# Patient Record
Sex: Male | Born: 1940 | Race: Black or African American | Hispanic: No | Marital: Married | Smoking: Never smoker
Health system: Southern US, Community
[De-identification: ages and names within clinical notes are randomized; demographics above are authoritative.]

## PROBLEM LIST (undated history)

## (undated) DIAGNOSIS — R413 Other amnesia: Secondary | ICD-10-CM

## (undated) DIAGNOSIS — D649 Anemia, unspecified: Secondary | ICD-10-CM

## (undated) DIAGNOSIS — Z8371 Family history of colonic polyps: Secondary | ICD-10-CM

## (undated) DIAGNOSIS — Z83719 Family history of colon polyps, unspecified: Secondary | ICD-10-CM

## (undated) DIAGNOSIS — R972 Elevated prostate specific antigen [PSA]: Secondary | ICD-10-CM

## (undated) DIAGNOSIS — E785 Hyperlipidemia, unspecified: Secondary | ICD-10-CM

## (undated) DIAGNOSIS — Z789 Other specified health status: Secondary | ICD-10-CM

## (undated) HISTORY — DX: Other amnesia: R41.3

## (undated) HISTORY — PX: CATARACT EXTRACTION: SUR2

## (undated) HISTORY — DX: Family history of colon polyps, unspecified: Z83.719

## (undated) HISTORY — PX: HEMORRHOID SURGERY: SHX153

## (undated) HISTORY — PX: OTHER SURGICAL HISTORY: SHX169

## (undated) HISTORY — DX: Other specified health status: Z78.9

## (undated) HISTORY — DX: Elevated prostate specific antigen (PSA): R97.20

## (undated) HISTORY — DX: Anemia, unspecified: D64.9

## (undated) HISTORY — PX: COLONOSCOPY W/ POLYPECTOMY: SHX1380

## (undated) HISTORY — DX: Hyperlipidemia, unspecified: E78.5

## (undated) HISTORY — DX: Family history of colonic polyps: Z83.71

---

## 1999-03-05 ENCOUNTER — Encounter: Admission: RE | Admit: 1999-03-05 | Discharge: 1999-06-03 | Payer: Self-pay | Admitting: Internal Medicine

## 1999-07-02 ENCOUNTER — Encounter: Admission: RE | Admit: 1999-07-02 | Discharge: 1999-09-30 | Payer: Self-pay | Admitting: Internal Medicine

## 2004-09-05 ENCOUNTER — Ambulatory Visit: Payer: Self-pay | Admitting: Internal Medicine

## 2004-09-06 ENCOUNTER — Ambulatory Visit: Payer: Self-pay | Admitting: Internal Medicine

## 2004-10-17 ENCOUNTER — Ambulatory Visit: Payer: Self-pay | Admitting: Internal Medicine

## 2004-12-11 ENCOUNTER — Ambulatory Visit: Payer: Self-pay | Admitting: Internal Medicine

## 2004-12-17 ENCOUNTER — Encounter: Admission: RE | Admit: 2004-12-17 | Discharge: 2004-12-17 | Payer: Self-pay | Admitting: Internal Medicine

## 2005-02-12 ENCOUNTER — Ambulatory Visit: Payer: Self-pay | Admitting: Internal Medicine

## 2005-07-01 ENCOUNTER — Ambulatory Visit (HOSPITAL_COMMUNITY): Admission: RE | Admit: 2005-07-01 | Discharge: 2005-07-01 | Payer: Self-pay | Admitting: Internal Medicine

## 2005-07-01 ENCOUNTER — Ambulatory Visit: Payer: Self-pay | Admitting: Internal Medicine

## 2005-09-08 ENCOUNTER — Ambulatory Visit: Payer: Self-pay | Admitting: Internal Medicine

## 2006-07-10 DIAGNOSIS — E119 Type 2 diabetes mellitus without complications: Secondary | ICD-10-CM

## 2006-07-15 ENCOUNTER — Ambulatory Visit: Payer: Self-pay | Admitting: Internal Medicine

## 2006-07-24 ENCOUNTER — Ambulatory Visit: Payer: Self-pay | Admitting: Internal Medicine

## 2007-11-17 ENCOUNTER — Ambulatory Visit: Payer: Self-pay | Admitting: Internal Medicine

## 2007-11-17 DIAGNOSIS — R972 Elevated prostate specific antigen [PSA]: Secondary | ICD-10-CM

## 2007-11-23 ENCOUNTER — Encounter (INDEPENDENT_AMBULATORY_CARE_PROVIDER_SITE_OTHER): Payer: Self-pay | Admitting: *Deleted

## 2007-11-23 ENCOUNTER — Telehealth (INDEPENDENT_AMBULATORY_CARE_PROVIDER_SITE_OTHER): Payer: Self-pay | Admitting: *Deleted

## 2007-11-25 ENCOUNTER — Encounter (INDEPENDENT_AMBULATORY_CARE_PROVIDER_SITE_OTHER): Payer: Self-pay | Admitting: *Deleted

## 2007-11-25 ENCOUNTER — Ambulatory Visit: Payer: Self-pay | Admitting: Internal Medicine

## 2007-11-25 LAB — CONVERTED CEMR LAB
OCCULT 1: NEGATIVE
OCCULT 2: NEGATIVE
OCCULT 3: NEGATIVE

## 2007-12-20 ENCOUNTER — Encounter: Payer: Self-pay | Admitting: Internal Medicine

## 2008-05-11 ENCOUNTER — Ambulatory Visit: Payer: Self-pay | Admitting: Internal Medicine

## 2008-05-14 LAB — CONVERTED CEMR LAB: Hgb A1c MFr Bld: 6.8 % — ABNORMAL HIGH (ref 4.6–6.5)

## 2008-05-16 ENCOUNTER — Encounter (INDEPENDENT_AMBULATORY_CARE_PROVIDER_SITE_OTHER): Payer: Self-pay | Admitting: *Deleted

## 2008-05-16 ENCOUNTER — Telehealth (INDEPENDENT_AMBULATORY_CARE_PROVIDER_SITE_OTHER): Payer: Self-pay | Admitting: *Deleted

## 2008-05-29 ENCOUNTER — Ambulatory Visit: Payer: Self-pay | Admitting: Internal Medicine

## 2008-05-29 DIAGNOSIS — Z860100 Personal history of colon polyps, unspecified: Secondary | ICD-10-CM | POA: Insufficient documentation

## 2008-06-01 ENCOUNTER — Encounter: Payer: Self-pay | Admitting: Internal Medicine

## 2008-06-05 ENCOUNTER — Encounter (INDEPENDENT_AMBULATORY_CARE_PROVIDER_SITE_OTHER): Payer: Self-pay | Admitting: *Deleted

## 2008-06-05 LAB — CONVERTED CEMR LAB
BUN: 12 mg/dL (ref 6–23)
Basophils Absolute: 0 10*3/uL (ref 0.0–0.1)
Basophils Relative: 0.5 % (ref 0.0–3.0)
Creatinine, Ser: 0.7 mg/dL (ref 0.4–1.5)
Eosinophils Absolute: 0.6 10*3/uL (ref 0.0–0.7)
Eosinophils Relative: 18 % — ABNORMAL HIGH (ref 0.0–5.0)
Folate: 7.1 ng/mL
HCT: 37.8 % — ABNORMAL LOW (ref 39.0–52.0)
Hemoglobin: 12.8 g/dL — ABNORMAL LOW (ref 13.0–17.0)
Iron: 71 ug/dL (ref 42–165)
Lymphocytes Relative: 42.8 % (ref 12.0–46.0)
Lymphs Abs: 1.3 10*3/uL (ref 0.7–4.0)
MCHC: 33.8 g/dL (ref 30.0–36.0)
MCV: 91.7 fL (ref 78.0–100.0)
Monocytes Absolute: 0.3 10*3/uL (ref 0.1–1.0)
Monocytes Relative: 10.7 % (ref 3.0–12.0)
Neutro Abs: 0.9 10*3/uL — ABNORMAL LOW (ref 1.4–7.7)
Neutrophils Relative %: 28 % — ABNORMAL LOW (ref 43.0–77.0)
Platelets: 187 10*3/uL (ref 150.0–400.0)
Potassium: 4.2 meq/L (ref 3.5–5.1)
RBC: 4.11 M/uL — ABNORMAL LOW (ref 4.22–5.81)
RDW: 12.8 % (ref 11.5–14.6)
Saturation Ratios: 17 % — ABNORMAL LOW (ref 20.0–50.0)
Transferrin: 298.5 mg/dL (ref 212.0–360.0)
Uric Acid, Serum: 5.6 mg/dL (ref 4.0–7.8)
Vitamin B-12: 398 pg/mL (ref 211–911)
WBC: 3.1 10*3/uL — ABNORMAL LOW (ref 4.5–10.5)

## 2008-08-29 ENCOUNTER — Ambulatory Visit: Payer: Self-pay | Admitting: Internal Medicine

## 2008-09-03 LAB — CONVERTED CEMR LAB
BUN: 13 mg/dL (ref 6–23)
Creatinine, Ser: 0.7 mg/dL (ref 0.4–1.5)
Hgb A1c MFr Bld: 6.3 % (ref 4.6–6.5)

## 2008-09-04 ENCOUNTER — Encounter (INDEPENDENT_AMBULATORY_CARE_PROVIDER_SITE_OTHER): Payer: Self-pay | Admitting: *Deleted

## 2008-12-25 ENCOUNTER — Encounter: Payer: Self-pay | Admitting: Internal Medicine

## 2009-04-13 ENCOUNTER — Telehealth (INDEPENDENT_AMBULATORY_CARE_PROVIDER_SITE_OTHER): Payer: Self-pay | Admitting: *Deleted

## 2009-04-26 ENCOUNTER — Ambulatory Visit: Payer: Self-pay | Admitting: Internal Medicine

## 2009-04-26 DIAGNOSIS — E785 Hyperlipidemia, unspecified: Secondary | ICD-10-CM

## 2009-04-26 DIAGNOSIS — D649 Anemia, unspecified: Secondary | ICD-10-CM

## 2009-04-30 LAB — CONVERTED CEMR LAB
ALT: 25 units/L (ref 0–53)
AST: 25 units/L (ref 0–37)
Albumin: 3.9 g/dL (ref 3.5–5.2)
Alkaline Phosphatase: 52 units/L (ref 39–117)
BUN: 13 mg/dL (ref 6–23)
Basophils Absolute: 0 10*3/uL (ref 0.0–0.1)
Basophils Relative: 0.5 % (ref 0.0–3.0)
Bilirubin, Direct: 0.1 mg/dL (ref 0.0–0.3)
CO2: 30 meq/L (ref 19–32)
Calcium: 9.7 mg/dL (ref 8.4–10.5)
Chloride: 108 meq/L (ref 96–112)
Cholesterol: 183 mg/dL (ref 0–200)
Creatinine, Ser: 0.7 mg/dL (ref 0.4–1.5)
Creatinine,U: 164.2 mg/dL
Eosinophils Absolute: 0.1 10*3/uL (ref 0.0–0.7)
Eosinophils Relative: 2.8 % (ref 0.0–5.0)
Folate: 8.7 ng/mL
GFR calc non Af Amer: 145.01 mL/min (ref >60)
Glucose, Bld: 98 mg/dL (ref 70–99)
HCT: 38.5 % — ABNORMAL LOW (ref 39.0–52.0)
HDL: 72.3 mg/dL (ref >39.00)
Hemoglobin: 12.6 g/dL — ABNORMAL LOW (ref 13.0–17.0)
Hgb A1c MFr Bld: 6.5 % (ref 4.6–6.5)
Iron: 75 ug/dL (ref 42–165)
LDL Cholesterol: 99 mg/dL (ref 0–99)
Lymphocytes Relative: 30.9 % (ref 12.0–46.0)
Lymphs Abs: 1 10*3/uL (ref 0.7–4.0)
MCHC: 32.8 g/dL (ref 30.0–36.0)
MCV: 95.6 fL (ref 78.0–100.0)
Microalb Creat Ratio: 12.8 mg/g (ref 0.0–30.0)
Microalb, Ur: 2.1 mg/dL — ABNORMAL HIGH (ref 0.0–1.9)
Monocytes Absolute: 0.3 10*3/uL (ref 0.1–1.0)
Monocytes Relative: 10.6 % (ref 3.0–12.0)
Neutro Abs: 1.9 10*3/uL (ref 1.4–7.7)
Neutrophils Relative %: 55.2 % (ref 43.0–77.0)
Platelets: 173 10*3/uL (ref 150.0–400.0)
Potassium: 4.3 meq/L (ref 3.5–5.1)
RBC: 4.02 M/uL — ABNORMAL LOW (ref 4.22–5.81)
RDW: 12.3 % (ref 11.5–14.6)
Saturation Ratios: 16.5 % — ABNORMAL LOW (ref 20.0–50.0)
Sodium: 142 meq/L (ref 135–145)
TSH: 2.27 microintl units/mL (ref 0.35–5.50)
Total Bilirubin: 0.6 mg/dL (ref 0.3–1.2)
Total CHOL/HDL Ratio: 3
Total Protein: 7.2 g/dL (ref 6.0–8.3)
Transferrin: 323.8 mg/dL (ref 212.0–360.0)
Triglycerides: 59 mg/dL (ref 0.0–149.0)
VLDL: 11.8 mg/dL (ref 0.0–40.0)
Vitamin B-12: 277 pg/mL (ref 211–911)
WBC: 3.3 10*3/uL — ABNORMAL LOW (ref 4.5–10.5)

## 2009-05-04 ENCOUNTER — Telehealth (INDEPENDENT_AMBULATORY_CARE_PROVIDER_SITE_OTHER): Payer: Self-pay | Admitting: *Deleted

## 2009-12-28 ENCOUNTER — Encounter: Payer: Self-pay | Admitting: Internal Medicine

## 2010-01-14 ENCOUNTER — Telehealth (INDEPENDENT_AMBULATORY_CARE_PROVIDER_SITE_OTHER): Payer: Self-pay | Admitting: *Deleted

## 2010-01-18 ENCOUNTER — Ambulatory Visit: Payer: Self-pay | Admitting: Internal Medicine

## 2010-01-21 LAB — CONVERTED CEMR LAB
Hgb A1c MFr Bld: 6.4 % (ref 4.6–6.5)
Vitamin B-12: 269 pg/mL (ref 211–911)

## 2010-03-10 LAB — CONVERTED CEMR LAB
ALT: 20 units/L (ref 0–40)
ALT: 20 units/L (ref 0–53)
AST: 19 units/L (ref 0–37)
AST: 21 units/L (ref 0–37)
Albumin: 3.8 g/dL (ref 3.5–5.2)
Alkaline Phosphatase: 53 units/L (ref 39–117)
BUN: 11 mg/dL (ref 6–23)
BUN: 9 mg/dL (ref 6–23)
Basophils Absolute: 0 10*3/uL (ref 0.0–0.1)
Basophils Absolute: 0 10*3/uL (ref 0.0–0.1)
Basophils Relative: 0.3 % (ref 0.0–1.0)
Basophils Relative: 0.7 % (ref 0.0–3.0)
Bilirubin, Direct: 0.1 mg/dL (ref 0.0–0.3)
CO2: 31 meq/L (ref 19–32)
Calcium: 9.4 mg/dL (ref 8.4–10.5)
Chloride: 107 meq/L (ref 96–112)
Cholesterol: 154 mg/dL (ref 0–200)
Cholesterol: 181 mg/dL (ref 0–200)
Creatinine, Ser: 0.7 mg/dL (ref 0.4–1.5)
Creatinine, Ser: 0.8 mg/dL (ref 0.4–1.5)
Creatinine,U: 110.9 mg/dL
Creatinine,U: 181.3 mg/dL
Eosinophils Absolute: 0.2 10*3/uL (ref 0.0–0.6)
Eosinophils Absolute: 0.2 10*3/uL (ref 0.0–0.7)
Eosinophils Relative: 4.6 % (ref 0.0–5.0)
Eosinophils Relative: 5.8 % — ABNORMAL HIGH (ref 0.0–5.0)
GFR calc Af Amer: 146 mL/min
GFR calc non Af Amer: 121 mL/min
Glucose, Bld: 89 mg/dL (ref 70–99)
HCT: 35.4 % — ABNORMAL LOW (ref 39.0–52.0)
HCT: 37.4 % — ABNORMAL LOW (ref 39.0–52.0)
HDL: 48.9 mg/dL (ref >39.0)
HDL: 65.6 mg/dL (ref >39.0)
Hemoglobin: 12.2 g/dL — ABNORMAL LOW (ref 13.0–17.0)
Hemoglobin: 12.8 g/dL — ABNORMAL LOW (ref 13.0–17.0)
Hgb A1c MFr Bld: 6.1 % — ABNORMAL HIGH (ref 4.6–6.0)
Hgb A1c MFr Bld: 6.7 % — ABNORMAL HIGH (ref 4.6–6.0)
LDL Cholesterol: 102 mg/dL — ABNORMAL HIGH (ref 0–99)
LDL Cholesterol: 87 mg/dL (ref 0–99)
Lymphocytes Relative: 29 % (ref 12.0–46.0)
Lymphocytes Relative: 46 % (ref 12.0–46.0)
MCHC: 34.3 g/dL (ref 30.0–36.0)
MCHC: 34.6 g/dL (ref 30.0–36.0)
MCV: 92.7 fL (ref 78.0–100.0)
MCV: 94.1 fL (ref 78.0–100.0)
Microalb Creat Ratio: 17.7 mg/g (ref 0.0–30.0)
Microalb Creat Ratio: 9 mg/g (ref 0.0–30.0)
Microalb, Ur: 1 mg/dL (ref 0.0–1.9)
Microalb, Ur: 3.2 mg/dL — ABNORMAL HIGH (ref 0.0–1.9)
Monocytes Absolute: 0.4 10*3/uL (ref 0.1–1.0)
Monocytes Absolute: 0.4 10*3/uL (ref 0.2–0.7)
Monocytes Relative: 12.1 % — ABNORMAL HIGH (ref 3.0–12.0)
Monocytes Relative: 8.5 % (ref 3.0–11.0)
Neutro Abs: 1.2 10*3/uL — ABNORMAL LOW (ref 1.4–7.7)
Neutro Abs: 2.9 10*3/uL (ref 1.4–7.7)
Neutrophils Relative %: 35.4 % — ABNORMAL LOW (ref 43.0–77.0)
Neutrophils Relative %: 57.6 % (ref 43.0–77.0)
Platelets: 186 10*3/uL (ref 150–400)
Platelets: 202 10*3/uL (ref 150–400)
Potassium: 3.6 meq/L (ref 3.5–5.1)
Potassium: 4.2 meq/L (ref 3.5–5.1)
RBC: 3.82 M/uL — ABNORMAL LOW (ref 4.22–5.81)
RBC: 3.97 M/uL — ABNORMAL LOW (ref 4.22–5.81)
RDW: 11.7 % (ref 11.5–14.6)
RDW: 12.2 % (ref 11.5–14.6)
Sodium: 144 meq/L (ref 135–145)
TSH: 2.17 microintl units/mL (ref 0.35–5.50)
Total Bilirubin: 0.5 mg/dL (ref 0.3–1.2)
Total CHOL/HDL Ratio: 2.8
Total CHOL/HDL Ratio: 3.1
Total Protein: 7 g/dL (ref 6.0–8.3)
Triglycerides: 66 mg/dL (ref 0–149)
Triglycerides: 91 mg/dL (ref 0–149)
VLDL: 13 mg/dL (ref 0–40)
VLDL: 18 mg/dL (ref 0–40)
WBC: 3.3 10*3/uL — ABNORMAL LOW (ref 4.5–10.5)
WBC: 4.9 10*3/uL (ref 4.5–10.5)

## 2010-03-12 NOTE — Letter (Signed)
Summary: Blake Wilcox  [facility]   Imported By: Lanelle Bal 01/07/2010 13:39:39  _____________________________________________________________________  External Attachment:    Type:   Image     Comment:   External Document

## 2010-03-12 NOTE — Progress Notes (Signed)
Summary: refill  Phone Note Refill Request Message from:  Fax from Pharmacy on May 04, 2009 9:58 AM  Refills Requested: Medication #1:  GLUCOPHAGE XR 500 MG TB24 take 2 tabs two times a day [facility] pharmacy [address] fax [phone]   Method Requested: Fax to Local Pharmacy Next Appointment Scheduled: no appt Initial call taken by: Barb Merino,  May 04, 2009 9:59 AM    Prescriptions: GLUCOPHAGE XR 500 MG TB24 (METFORMIN HCL) take 2 tabs two times a day  #360 x 1   Entered by:   Shonna Chock   Authorized by:   Marga Melnick MD   Signed by:   Shonna Chock on 05/04/2009   Method used:   [facility]ronically to        [facility] [phone]* (retail)       [address] [place], [place]  [address]       Ph: [phone] or [phone]       Fax: [phone]   RxID:   [id number]

## 2010-03-12 NOTE — Progress Notes (Signed)
Summary: lmom  12/5, sched labs, resent prescription  Phone Note Call from Patient Call back at Home Phone [phone]   Caller: Patient Summary of Call: patient called to report that prescription for metformin was not at [facility] , [address], [place]--i have sent it to [facility] to call it in again  Patient needs labs a1c=250.00, check b12 level=285.9 Initial call taken by: Jerolyn Shin,  January 14, 2010 11:56 AM  Follow-up for Phone Call        patient called back, informed him we called prescription in again for him, also scheduled labs for Fri 12/9 Follow-up by: Jerolyn Shin,  January 15, 2010 8:58 AM

## 2010-03-12 NOTE — Assessment & Plan Note (Signed)
Summary: Blake Wilcox will be fasting//lch   Vital Signs:  Patient profile:   70 year old male Weight:      196 pounds BMI:     26.68 Pulse rate:   72 / minute Resp:     14 per minute BP sitting:   114 / 80  (left arm) Cuff size:   large  Vitals Entered By: Shonna Chock (April 26, 2009 8:05 AM) CC: Follow-up visit:Fasting for labs  Comments REVIEWED MED LIST, PATIENT AGREED DOSE AND INSTRUCTION CORRECT    CC:  Follow-up visit:Fasting for labs .  History of Present Illness: FBS 94-166; no post meal glucose. No hypoglycemia. Weight stable. No retinopathy @ Ophth exam; repeat exam pending.     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 Eyes:  Denies blurring, double vision, and vision loss-both eyes. ENT:  Denies difficulty swallowing and hoarseness. CV:  Denies chest pain or discomfort, leg cramps with exertion, lightheadness, near fainting, palpitations, and shortness of breath with exertion. Resp:  Denies cough, shortness of breath, and sputum productive. GI:  Denies abdominal pain, bloody stools, dark tarry stools, and indigestion. GU:  Dr Legrand Pitts seen in 03/2009. MS:  Denies joint pain, low back pain, mid back pain, and thoracic pain. Derm:  Denies lesion(s) and poor wound healing. Neuro:  Denies numbness, poor balance, and tingling. Endo:  Denies excessive hunger, excessive thirst, and excessive urination. Heme:  Denies  abnormal bruising and bleeding.  Physical Exam  General:  well-nourished; alert,appropriate and cooperative throughout examination Eyes:  No corneal or conjunctival inflammation noted.Perrla. Funduscopic exam benign, without hemorrhages, exudates or papilledema. Tigroid fundi Neck:  No deformities, masses, or tenderness noted. Lungs:  Normal respiratory effort, chest expands symmetrically. Lungs are clear to auscultation, no crackles or wheezes. Heart:  regular rhythm, no murmur, no gallop, no rub, no JVD, no HJR, and bradycardia.   Abdomen:  Bowel sounds positive,abdomen soft and non-tender without masses, organomegaly or hernias noted. Genitalia:  Dr Legrand Pitts Pulses:  R and L carotid,radial,dorsalis pedis and posterior tibial pulses are full and equal bilaterally Extremities:  No clubbing, cyanosis, edema. Deformed R thumb nail & L great toenail Neurologic:  alert & oriented X3, sensation intact to light touch over feet, and DTRs symmetrical and normal.   Skin:  Intact without suspicious lesions or rashes Cervical Nodes:  No lymphadenopathy noted Axillary Nodes:  No palpable lymphadenopathy Psych:  memory intact for recent and remote, normally interactive, and good eye contact.     Impression & Recommendations:  Problem # 1:  DIABETES MELLITUS, TYPE II (ICD-250.00)  His updated medication list for this problem includes:    Glucotrol Xl 10 Mg Tb24 (Glipizide)    Glucophage Xr 500 Mg Tb24 (Metformin hcl) .Marland Kitchen... Take 2 tabs two times a day  Orders: Venipuncture ([address]) TLB-BMP (Basic Metabolic Panel-BMET) (80048-METABOL) TLB-A1C / Hgb A1C (Glycohemoglobin) (83036-A1C) TLB-Microalbumin/Creat Ratio, Urine (82043-MALB) Podiatry Referral (Podiatry)  Problem # 2:  HYPERLIPIDEMIA (ICD-272.4) Assessment: Unchanged  minimal  elevation of LDL(108)  Orders: Venipuncture ([address]) TLB-Lipid Panel (80061-LIPID)  TLB-Hepatic/Liver Function Pnl (80076-HEPATIC) TLB-TSH (Thyroid Stimulating  Hormone) (84443-TSH)  Problem # 3:  ANEMIA-NOS (ICD-285.9)  PMH of  Orders: Venipuncture (16109) TLB-CBC Platelet - w/Differential (85025-CBCD) TLB-B12 + Folate Pnl (60454_09811-B14/NWG) TLB-IBC Pnl (Iron/FE;Transferrin) (83550-IBC)  Problem # 4:  ELEVATED PROSTATE SPECIFIC ANTIGEN (ICD-790.93)  as per Dr Legrand Pitts  Orders: Venipuncture (95621)  Problem # 5:  COLONIC POLYPS, HX OF (ICD-V12.72) last 2005; repeat due (discussed)  Complete Medication List: 1)  Glucotrol Xl 10 Mg Tb24 (Glipizide) 2)  Glucophage Xr 500 Mg Tb24 (Metformin hcl) .... Take 2 tabs two times a day 3)  Voltaren 1 % Gel (Diclofenac sodium) .... Apply two times a day to knee as needed pain  Patient Instructions: 1)  Schedule a colonoscopy  to help detect colon cancer. 2)  Check your blood sugars regularly. If your readings are usually above :150  or below 90  you should contact our office. 3)  See your eye doctor yearly to check for diabetic eye damage. 4)  Check your feet each night for sore areas, calluses or signs of infection. Prescriptions: GLUCOPHAGE XR 500 MG TB24 (METFORMIN HCL) take 2 tabs two times a day  #360 x 1   Entered and Authorized by:   Marga Melnick MD   Signed by:   Marga Melnick MD on 04/26/2009   Method used:   Faxed to ...       [facility] [phone]* (retail)       [address] [place], [place]  [address]       Ph: [phone] or [phone]       Fax: [phone]   RxID:   [id number] GLUCOTROL XL 10 MG TB24 (GLIPIZIDE)   #90 x 1   Entered and Authorized by:   Marga Melnick MD   Signed by:   Marga Melnick MD on 04/26/2009   Method used:   Print then Give to Patient   RxID:   [id number]

## 2010-03-12 NOTE — Progress Notes (Signed)
Summary: glucophage refill  Phone Note Refill Request Message from:  Patient on January 14, 2010 11:44 AM  Refills Requested: Medication #1:  GLUCOPHAGE XR 500 MG TB24 take 2 tabs two times a day**LABS DUE** [facility], [address], [place],---says he went to pick it up and they didnt have it--please call it again  Initial call taken by: Jerolyn Shin,  January 14, 2010 11:45 AM    Prescriptions: GLUCOPHAGE XR 500 MG TB24 (METFORMIN HCL) take 2 tabs two times a day**LABS DUE**  #120 x 0   Entered by:   Shonna Chock CMA   Authorized by:   Marga Melnick MD   Signed by:   Shonna Chock CMA on 01/14/2010   Method used:   Faxed to ...       [facility] [phone]* (retail)       [address] [place], [place]  [address]       Ph: [phone] or [phone]       Fax: [phone]   RxID:   [id number]

## 2010-03-12 NOTE — Progress Notes (Signed)
Summary: METFORMIN REFILL   Phone Note Refill Request Message from:  Fax from Pharmacy on April 13, 2009 8:57 AM  METFORMIN 500MG  [facility] FAX [phone]   Method Requested: Fax to Local Pharmacy Next Appointment Scheduled: NO APPT Initial call taken by: Barb Merino,  April 13, 2009 8:59 AM    New/Updated Medications: GLUCOPHAGE XR 500 MG TB24 (METFORMIN HCL) take 2 tabs two times a day-LABS DUE NOW Prescriptions: GLUCOPHAGE XR 500 MG TB24 (METFORMIN HCL) take 2 tabs two times a day-LABS DUE NOW  #30 x 0   Entered by:   Doristine Devoid   Authorized by:   Marga Melnick MD   Signed by:   Doristine Devoid on 04/13/2009   Method used:   Electronically to        [facility] [phone]* (retail)       [address] [place], [place]  [address]       Ph: [phone] or [phone]       Fax: [phone]   RxID:   [id number]

## 2010-03-21 ENCOUNTER — Ambulatory Visit (INDEPENDENT_AMBULATORY_CARE_PROVIDER_SITE_OTHER): Payer: BC Managed Care – PPO | Admitting: Internal Medicine

## 2010-03-21 ENCOUNTER — Encounter: Payer: Self-pay | Admitting: Internal Medicine

## 2010-03-21 ENCOUNTER — Other Ambulatory Visit: Payer: Self-pay | Admitting: Internal Medicine

## 2010-03-21 DIAGNOSIS — E785 Hyperlipidemia, unspecified: Secondary | ICD-10-CM

## 2010-03-21 DIAGNOSIS — R350 Frequency of micturition: Secondary | ICD-10-CM

## 2010-03-21 DIAGNOSIS — R358 Other polyuria: Secondary | ICD-10-CM

## 2010-03-21 DIAGNOSIS — E1165 Type 2 diabetes mellitus with hyperglycemia: Secondary | ICD-10-CM

## 2010-03-21 LAB — CREATININE, SERUM: Creatinine, Ser: 0.8 mg/dL (ref 0.4–1.5)

## 2010-03-21 LAB — CONVERTED CEMR LAB
Bilirubin Urine: NEGATIVE
Blood in Urine, dipstick: NEGATIVE
Ketones, urine, test strip: NEGATIVE
Nitrite: NEGATIVE
Protein, U semiquant: NEGATIVE
Specific Gravity, Urine: 1.01
WBC Urine, dipstick: NEGATIVE
pH: 6

## 2010-03-21 LAB — POTASSIUM: Potassium: 4.2 mEq/L (ref 3.5–5.1)

## 2010-03-21 LAB — AST: AST: 22 U/L (ref 0–37)

## 2010-03-21 LAB — LIPID PANEL
Cholesterol: 189 mg/dL (ref 0–200)
HDL: 78.5 mg/dL (ref >39.00)
LDL Cholesterol: 103 mg/dL — ABNORMAL HIGH (ref 0–99)
Total CHOL/HDL Ratio: 2
Triglycerides: 38 mg/dL (ref 0.0–149.0)
VLDL: 7.6 mg/dL (ref 0.0–40.0)

## 2010-03-21 LAB — HEMOGLOBIN A1C: Hgb A1c MFr Bld: 10.8 % — ABNORMAL HIGH (ref 4.6–6.5)

## 2010-03-21 LAB — BUN: BUN: 13 mg/dL (ref 6–23)

## 2010-03-28 NOTE — Assessment & Plan Note (Signed)
Summary: Frequent urination/denies dysuria/kb   Vital Signs:  Patient profile:   70 year old male Weight:      187.2 pounds BMI:     25.48 Temp:     98.7 degrees F oral Pulse rate:   72 / minute Resp:     14 per minute BP sitting:   118 / 76  (left arm) Cuff size:   large  Vitals Entered By: Shonna Chock CMA (March 21, 2010 8:08 AM) CC: Frequent Urination x 1 month, patient off all meds, Type 2 diabetes mellitus follow-up   CC:  Frequent Urination x 1 month, patient off all meds, and Type 2 diabetes mellitus follow-up.  History of Present Illness:    He has had frequency for 2 months since he has been off his Diabetes meds. He did not think they had been renewed, but the EMR confirms renewal 01/14/10.  The patient reports weight loss of 8#, but denies polydipsia, blurred vision, and numbness of extremities.  The patient denies the following symptoms: neuropathic pain, chest pain, vomiting, orthostatic symptoms, poor wound healing, intermittent claudication, vision loss, and foot ulcer.  Since the last visit the patient reports monitoring blood glucose.  The patient has been measuring capillary blood glucose before breakfast. Values had been 80- 130 but progressively rising. It was 322 on 02/08. Since the last visit, the patient reports having had eye care by an ophthalmologist.  No retinopathy as per Dr Ashley Royalty note 11/11.  Dip urine: glucose > 1000.  Current Medications (verified): 1)  None  Allergies (verified): No Known Drug Allergies  Review of Systems General:  Denies chills, fever, and sweats. GU:  Denies discharge, dysuria, and hematuria.  Physical Exam  General:  well-nourished,in no acute distress; alert,appropriate and cooperative throughout examination Lungs:  Normal respiratory effort, chest expands symmetrically. Lungs are clear to auscultation, no crackles or wheezes. Heart:  normal rate, regular rhythm, no gallop, no rub, no JVD, no HJR, and grade 1.5 /6  systolic murmur.   Abdomen:  Bowel sounds positive,abdomen soft and non-tender without masses, organomegaly or hernias noted. No suprapubic distention Pulses:  R and L carotid,radial,dorsalis pedis and posterior tibial pulses are full and equal bilaterally Extremities:  No clubbing, cyanosis, edema. Thickened nails Neurologic:  alert & oriented X3.   Skin:  Intact without suspicious lesions or rashes Psych:  memory intact for recent and remote, normally interactive, and good eye contact.     Impression & Recommendations:  Problem # 1:  POLYURIA (ZOX-096.04)  undoubtedly due to loss of control of DM  Orders: Venipuncture (54098)  Problem # 2:  DIABETES MELLITUS, UNCONTROLLED (ICD-250.02)  FBS 322 on on02/08 The following medications were removed from the medication list:    Glucotrol Xl 10 Mg Tb24 (Glipizide)    Glucophage Xr 500 Mg Tb24 (Metformin hcl) .Marland Kitchen... Take 2 tabs two times a day**labs due** His updated medication list for this problem includes:    Glimepiride 2 Mg Tabs (Glimepiride) .Marland Kitchen... 1 two times a day pre meals    Metformin Hcl 500 Mg Xr24h-tab (Metformin hcl) .Marland Kitchen... 2 two times a day pre meals  Orders: Venipuncture (11914) TLB-AST (SGOT) (84450-SGOT) TLB-Creatinine, Blood (82565-CREA) TLB-Potassium (K+) (84132-K) TLB-BUN (Urea Nitrogen) (84520-BUN) TLB-A1C / Hgb A1C (Glycohemoglobin) (83036-A1C)  Problem # 3:  HYPERLIPIDEMIA (ICD-272.4)  Orders: Venipuncture ([address]) TLB-Lipid Panel (80061-LIPID) TLB-ALT (SGPT) (84460-ALT)  Complete Medication List: 1)  Glimepiride 2 Mg Tabs (Glimepiride) .Marland Kitchen.. 1 two times a day pre meals 2)  Metformin Hcl  500 Mg Xr24h-tab (Metformin hcl) .... 2 two times a day pre meals  Other Orders: UA Dipstick w/o Micro (manual) (40981)  Patient Instructions: 1)  Check your blood sugars regularly. If your readings are usually above : 150 or below 90 you should contact our office. 2)  Check your feet each night for sore areas, calluses  or signs of infection. Prescriptions: METFORMIN HCL 500 MG XR24H-TAB (METFORMIN HCL) 2 two times a day pre meals  #120 x 5   Entered and Authorized by:   Marga Melnick MD   Signed by:   Marga Melnick MD on 03/21/2010   Method used:   Electronically to        [facility] [phone]* (retail)       [address] [place], [place]  [address]       Ph: [phone] or [phone]       Fax: [phone]   RxID:   [id number] GLIMEPIRIDE 2 MG TABS (GLIMEPIRIDE) 1 two times a day pre meals  #60 x 5   Entered and Authorized by:   Marga Melnick MD   Signed by:   Marga Melnick MD on 03/21/2010   Method used:   Electronically to        [facility] [phone]* (retail)       [address] [place], [place]  [address]       Ph: [phone] or [phone]       Fax: [phone]   RxID:   [id number]    Orders Added: 1)  UA Dipstick w/o Micro (manual) [81002] 2)  Est. Patient Level III [17616] 3)  Venipuncture [07371] 4)  TLB-Lipid Panel [80061-LIPID] 5)  TLB-ALT (SGPT) [84460-ALT] 6)  TLB-AST (SGOT) [84450-SGOT] 7)  TLB-Creatinine, Blood [82565-CREA] 8)  TLB-Potassium (K+) [84132-K] 9)  TLB-BUN (Urea Nitrogen) [84520-BUN] 10)  TLB-A1C / Hgb A1C (Glycohemoglobin) [83036-A1C]    Laboratory Results   Urine Tests    Routine Urinalysis   Color: straw Appearance: Clear Glucose: >=1000   (Normal Range: Negative) Bilirubin: negative   (Normal Range: Negative) Ketone: negative   (Normal Range: Negative) Spec. Gravity: 1.010   (Normal Range: 1.003-1.035) Blood: negative   (Normal Range: Negative) pH: 6.0   (Normal Range: 5.0-8.0) Protein: negative   (Normal Range: Negative) Urobilinogen: 0.2   (Normal Range: 0-1) Nitrite: negative   (Normal Range: Negative) Leukocyte Esterace: negative   (Normal Range: Negative)        Appended Document: Frequent urination/denies dysuria/kb

## 2010-03-28 NOTE — Miscellaneous (Signed)
Summary: Orders Update  Clinical Lists Changes  Orders: Added new Service order of Prescription Created Electronically (G8553) - Signed 

## 2010-05-07 ENCOUNTER — Telehealth: Payer: Self-pay | Admitting: *Deleted

## 2010-05-08 NOTE — Telephone Encounter (Signed)
Spoke w/ pt lab appt scheduled for recheck on A1C

## 2010-05-14 ENCOUNTER — Encounter: Payer: Self-pay | Admitting: Internal Medicine

## 2010-05-20 ENCOUNTER — Other Ambulatory Visit (INDEPENDENT_AMBULATORY_CARE_PROVIDER_SITE_OTHER): Payer: BC Managed Care – PPO

## 2010-05-20 DIAGNOSIS — E119 Type 2 diabetes mellitus without complications: Secondary | ICD-10-CM

## 2010-05-20 LAB — MICROALBUMIN / CREATININE URINE RATIO
Creatinine,U: 238.8 mg/dL
Microalb Creat Ratio: 2.6 mg/g (ref 0.0–30.0)
Microalb, Ur: 6.1 mg/dL — ABNORMAL HIGH (ref 0.0–1.9)

## 2010-05-20 LAB — HEMOGLOBIN A1C: Hgb A1c MFr Bld: 11.9 % — ABNORMAL HIGH (ref 4.6–6.5)

## 2010-06-25 NOTE — Letter (Signed)
July 15, 2006    Richard Puschinsky  [address]  [place], [place] [address]   RE:  TREYVEON, MOCHIZUKI  MRN:  [id number]  /  DOB:  27-Feb-1940   Dear Dr. Gerome Sam:   I saw Mr. Talent for a refill of medications and followup of his  diabetes on June 4.  He states that you were evaluating his abdominal  pain with MRIs and followup is scheduled June 26. Because you have been  monitoring his prostate, I did not perform a digital rectal exam.   His lipids were well-controlled; but his diabetes was suboptimally  controlled with an A1c of 6.7.  I have recommended he increase his  metformin to 1000 mg twice a day with 2 largest meals, with followup A1c  in late September.  He is supposed to share the lab results with you, as  he was given a hard copy.  His hemoglobin was 12.2 and hematocrit 35.4;  he has a history of prior colonpolypectomy.  Would you please recheck  his hematocrit when you see him later this month.  If there is no clear  urologic cause of his abdominal pain and the anemia progresses, repeat  GI consult will be indicated.  Otherwise, I will plan on rechecking the  CBC in September.   Thank you for your care of this nice gentleman.    Sincerely,      Titus Dubin. Alwyn Ren, MD,FACP,FCCP  Electronically Signed   [facility]  DD: 07/18/2006  DT: 07/18/2006  Job #: [id number]

## 2010-11-03 ENCOUNTER — Other Ambulatory Visit: Payer: Self-pay | Admitting: Internal Medicine

## 2010-11-04 NOTE — Telephone Encounter (Signed)
Patient needs a f/u appointment with Dr.Hopper

## 2010-11-18 ENCOUNTER — Encounter: Payer: Self-pay | Admitting: Internal Medicine

## 2010-11-18 ENCOUNTER — Ambulatory Visit (INDEPENDENT_AMBULATORY_CARE_PROVIDER_SITE_OTHER): Payer: BC Managed Care – PPO | Admitting: Internal Medicine

## 2010-11-18 VITALS — BP 118/72 | HR 68 | Temp 98.4°F | Wt 189.4 lb

## 2010-11-18 DIAGNOSIS — E785 Hyperlipidemia, unspecified: Secondary | ICD-10-CM

## 2010-11-18 DIAGNOSIS — Z23 Encounter for immunization: Secondary | ICD-10-CM

## 2010-11-18 DIAGNOSIS — D649 Anemia, unspecified: Secondary | ICD-10-CM

## 2010-11-18 DIAGNOSIS — E539 Vitamin B deficiency, unspecified: Secondary | ICD-10-CM

## 2010-11-18 MED ORDER — GLIMEPIRIDE 2 MG PO TABS: 2.0000 mg | ORAL_TABLET | Freq: Two times a day (BID) | ORAL | Status: DC

## 2010-11-18 NOTE — Patient Instructions (Signed)
Please complete stool cards.Annual podiatry and retinal assessments are indicated because the diabetes.

## 2010-11-18 NOTE — Progress Notes (Signed)
Subjective:    Patient ID: Blake Wilcox, male    DOB: 17-Nov-1940, 70 y.o.   MRN: [id number]  HPI Diabetes status assessment: Fasting or morning glucose range:  78- 207 or average :  Not calculated  . Highest glucose 2 hours after any meal:  no. Hypoglycemia :  no .                                                     Excess thirst; hunger; urination:  no.                                  Lightheadedness with standing:  no. Chest pain:  no ; Palpitations :no ;  Pain in  calves with walking:  no .                                                                                                                                 Non healing skin  ulcers or sores,especially over the feet:  no. Numbness or tingling or burning in feet : no .                                                                                                                                              Significant change in  Weight : stable. Vision changes : occasional blurring  .                                                                    Exercise : physical job . Nutrition/diet:  No plan. Medication compliance : yes. Medication adverse  Effects:  no . Eye exam : 2011, no retinopathy. Foot care : yes.  A1c/ urine microalbumin monitor:  A1c 11.9 in 4/12       Review of Systems he denies abdominal pain, melena or rectal bleeding. His  hematocrit was 38.5 in March of 2011. B12 level has been normal     Objective:   Physical Exam Gen.: Healthy  & well-nourished, appropriate and alert, weight Eyes: No lid/conjunctival changes, extraocular motion intact, fundi:no significant changes Neck: Normal range of motion, thyroid Respiratory: No increased work of breathing or abnormal breath sounds Cardiac : regular rhythm, no extra heart sounds, gallop. Short grade 1/ 6 systolic murmur @ apex Abdomen: No organomegaly ,masses, bruits or aortic enlargement Lymph: No lymphadenopathy of the neck or axilla Skin: No  rashes, lesions, ulcers or ischemic changes Muscle skeletal: chronic thickening of toe nails ; joints; minor flexion contractures LUE Vasc:All pulses intact, no bruits present Neuro: Normal deep tendon reflexes, alert & oriented, sensation over feet Psych: judgment and insight, mood and affect normal         Assessment & Plan:  #1 diabetes, risk and goals discussed.  #2 dyslipidemia  #3 anemia, past medical history Plan: see Orders & Recommendations

## 2010-12-02 ENCOUNTER — Other Ambulatory Visit (INDEPENDENT_AMBULATORY_CARE_PROVIDER_SITE_OTHER): Payer: BC Managed Care – PPO

## 2010-12-02 DIAGNOSIS — IMO0001 Reserved for inherently not codable concepts without codable children: Secondary | ICD-10-CM

## 2010-12-02 LAB — BASIC METABOLIC PANEL
Calcium: 9.4 mg/dL (ref 8.4–10.5)
GFR: 133.27 mL/min (ref >60.00)
Glucose, Bld: 124 mg/dL — ABNORMAL HIGH (ref 70–99)
Potassium: 4.6 mEq/L (ref 3.5–5.1)
Sodium: 139 mEq/L (ref 135–145)

## 2010-12-02 LAB — CBC WITH DIFFERENTIAL/PLATELET
Basophils Relative: 0.6 % (ref 0.0–3.0)
Eosinophils Relative: 2.6 % (ref 0.0–5.0)
HCT: 36.5 % — ABNORMAL LOW (ref 39.0–52.0)
Lymphs Abs: 1.4 10*3/uL (ref 0.7–4.0)
MCHC: 34.2 g/dL (ref 30.0–36.0)
MCV: 91.6 fl (ref 78.0–100.0)
Monocytes Absolute: 0.4 10*3/uL (ref 0.1–1.0)
Platelets: 204 10*3/uL (ref 150.0–400.0)
RBC: 3.98 Mil/uL — ABNORMAL LOW (ref 4.22–5.81)
WBC: 3.9 10*3/uL — ABNORMAL LOW (ref 4.5–10.5)

## 2010-12-02 LAB — LIPID PANEL
Cholesterol: 177 mg/dL (ref 0–200)
HDL: 64.8 mg/dL (ref >39.00)
Triglycerides: 74 mg/dL (ref 0.0–149.0)

## 2010-12-02 LAB — HEPATIC FUNCTION PANEL
ALT: 24 U/L (ref 0–53)
AST: 23 U/L (ref 0–37)
Total Bilirubin: 0.4 mg/dL (ref 0.3–1.2)
Total Protein: 7.5 g/dL (ref 6.0–8.3)

## 2010-12-02 LAB — HEMOGLOBIN A1C: Hgb A1c MFr Bld: 7.1 % — ABNORMAL HIGH (ref 4.6–6.5)

## 2010-12-02 NOTE — Progress Notes (Signed)
Labs only

## 2011-05-16 ENCOUNTER — Other Ambulatory Visit: Payer: Self-pay | Admitting: Internal Medicine

## 2011-05-16 NOTE — Telephone Encounter (Signed)
Refill done.  

## 2011-09-02 ENCOUNTER — Other Ambulatory Visit: Payer: Self-pay | Admitting: Internal Medicine

## 2011-09-03 NOTE — Telephone Encounter (Signed)
A1C 250.00 and CPX due

## 2011-10-01 ENCOUNTER — Other Ambulatory Visit: Payer: Self-pay | Admitting: Internal Medicine

## 2011-10-01 NOTE — Telephone Encounter (Signed)
A1C 250.00 

## 2012-02-18 ENCOUNTER — Ambulatory Visit (INDEPENDENT_AMBULATORY_CARE_PROVIDER_SITE_OTHER): Payer: BC Managed Care – PPO | Admitting: Internal Medicine

## 2012-02-18 VITALS — BP 106/64 | HR 67 | Wt 173.6 lb

## 2012-02-18 DIAGNOSIS — R972 Elevated prostate specific antigen [PSA]: Secondary | ICD-10-CM

## 2012-02-18 DIAGNOSIS — R358 Other polyuria: Secondary | ICD-10-CM

## 2012-02-18 DIAGNOSIS — R3589 Other polyuria: Secondary | ICD-10-CM

## 2012-02-18 DIAGNOSIS — R634 Abnormal weight loss: Secondary | ICD-10-CM

## 2012-02-18 DIAGNOSIS — IMO0001 Reserved for inherently not codable concepts without codable children: Secondary | ICD-10-CM

## 2012-02-18 DIAGNOSIS — R413 Other amnesia: Secondary | ICD-10-CM

## 2012-02-18 LAB — BASIC METABOLIC PANEL
BUN: 13 mg/dL (ref 6–23)
CO2: 30 mEq/L (ref 19–32)
Calcium: 10.1 mg/dL (ref 8.4–10.5)
Chloride: 99 mEq/L (ref 96–112)
Creatinine, Ser: 0.7 mg/dL (ref 0.4–1.5)
Glucose, Bld: 448 mg/dL — ABNORMAL HIGH (ref 70–99)

## 2012-02-18 LAB — CBC WITH DIFFERENTIAL/PLATELET
Basophils Relative: 0.4 % (ref 0.0–3.0)
Eosinophils Absolute: 0.1 10*3/uL (ref 0.0–0.7)
Eosinophils Relative: 2.1 % (ref 0.0–5.0)
Hemoglobin: 13.4 g/dL (ref 13.0–17.0)
MCHC: 33.5 g/dL (ref 30.0–36.0)
MCV: 91 fl (ref 78.0–100.0)
Monocytes Absolute: 0.3 10*3/uL (ref 0.1–1.0)
Neutro Abs: 2.4 10*3/uL (ref 1.4–7.7)
RBC: 4.38 Mil/uL (ref 4.22–5.81)
WBC: 3.7 10*3/uL — ABNORMAL LOW (ref 4.5–10.5)

## 2012-02-18 LAB — MICROALBUMIN / CREATININE URINE RATIO: Microalb Creat Ratio: 1.5 mg/g (ref 0.0–30.0)

## 2012-02-18 MED ORDER — GLUCOSE BLOOD VI STRP: ORAL_STRIP | Status: DC

## 2012-02-18 MED ORDER — SITAGLIPTIN PHOSPHATE 100 MG PO TABS: 50.0000 mg | ORAL_TABLET | Freq: Every day | ORAL | Status: DC

## 2012-02-18 MED ORDER — ONETOUCH DELICA LANCETS FINE MISC: 1.0000 [IU] | Status: DC

## 2012-02-18 NOTE — Patient Instructions (Addendum)
If you activate My Chart; the results can be released to you as soon as they populate from the lab. If you choose not to use this program; the labs have to be reviewed, copied & mailed   causing a delay in getting the results to you. Review and correct the record as indicated. Please share record with all medical staff seen.  

## 2012-02-18 NOTE — Progress Notes (Signed)
Subjective:    Patient ID: Blake Wilcox, male    DOB: 28-May-1940, 72 y.o.   MRN: [id number]  HPI  Diabetes status assessment: Fasting blood sugar range:" 365-666 " over past 2 mos Average fasting blood sugar:? Highest glucose 2 hours postmeal:not checked Hypoglycemia:no Weight change:down 50# over 6 mos. No abdominal pain , melena , or rectal bleeding Polydipsia/polyphagia/polyuria:excess thirst & urination Eye  Symptoms(blurring/diplopia/vision loss):no Cardiovascular symptoms(chest pain/claudication/postural lightheadedness):no but see below Neurologic symptoms (numbness/tingling/burning in extremities/syncope):no Skin (lesions/ ulcers/ color change):no Nutrition/diet:no plan Exercise:physically active on job Eye exam:2013; appt 02/19/12 Foot exam:this week, negative        Review of Systems Extremity pain Location:from RLS area to R calf Onset:4 mos ago Trigger/injury:no Pain quality:aching Pain severity: up to 10 Duration:up to 90 min Exacerbating factors:no Treatment/response:none Review of systems: Constitutional: no fever, chills, sweats  Musculoskeletal:no  muscle cramps or pain; R knee  joint stiffness w/o redness or swelling Skin:no rash, color change Neuro: no weakness;some urinary but nostool incontinence  Heme:no lymphadenopathy; abnormal bruising or bleeding        Objective:   Physical Exam Gen.: Thin but adequately nourished in appearance. Alert, appropriate and cooperative throughout exam. Head: Normocephalic without obvious abnormalities  Eyes: No corneal or conjunctival inflammation noted.  Extraocular motion intact.  Nose: External nasal exam reveals no deformity or inflammation. Nasal mucosa are pink and moist. No lesions or exudates noted.  Mouth: Oral mucosa and oropharynx reveal no lesions or exudates. Teeth in fair repair. Neck: No deformities, masses, or tenderness noted. Thyroid normal. Lungs: Normal respiratory effort; chest expands  symmetrically. Lungs are clear to auscultation without rales, wheezes, or increased work of breathing. Heart: Normal rate and rhythm. Normal S1 and S2. No gallop, click, or rub. Grade 1/2-1 over 6 systolic murmur @ apex Abdomen: Bowel sounds normal; abdomen soft and nontender. No masses, organomegaly or hernias noted.                                                                           Musculoskeletal/extremities: No deformity or scoliosis noted of  the thoracic or lumbar spine. No clubbing, cyanosis, edema, or deformity noted. Range of motion  normal .Tone & strength  normal.Joints normal. Thickened great toe nails; hyperpigmented thumb nails. Negative straight leg raising bilaterally. He is able to lie back and sit up without help Vascular: Carotid, radial artery, dorsalis pedis and  posterior tibial pulses are full and equal. No bruits present. Neurologic: Alert and oriented x3. Deep tendon reflexes symmetrical and normal.   Gait including tiptoe and heel walking is normal       Skin: Intact without suspicious lesions or rashes. Dry skin over legs Lymph: No cervical, axillary lymphadenopathy present. Psych: Mood and affect are normal. Normally interactive . He is able to give me the date and name of the present. He is unable to give me his son's birth date or middle name  Assessment & Plan:  #1 diabetes; history suggest severely uncontrolled diabetes. Associated polydipsia and polyuria.  #2 weight loss of 50 pounds. No associated GI symptoms  #3 clinical history suggestive of lumbar radiculopathy without neurologic deficit. Possible urinary incontinence.  #4 memory loss; additional testing will be necessary   Plan: See orders and recommendations. His glucometer will be replaced to document veracity of his readings

## 2012-02-19 ENCOUNTER — Telehealth: Payer: Self-pay

## 2012-02-19 NOTE — Telephone Encounter (Signed)
I spoke with patient, patient aware to stop by office to pick-up Levimir pen (in refrigerator on side B). Patient stated he is unable to come in tomorrow but will speak with his son to see if he is able to come in on Monday 02/25/2012. Patient will schedule appointment when he stops by to pick up Levimir

## 2012-02-19 NOTE — Telephone Encounter (Signed)
Message copied by Maurice Small on Thu Feb 19, 2012  8:20 AM ------      Message from: Pecola Lawless      Created: Wed Feb 18, 2012  6:05 PM       Pick up sample Levimir pen & start 20 units same time every day. OV Friday with son , Danelle Earthly, if possible.

## 2012-02-20 ENCOUNTER — Ambulatory Visit: Payer: BC Managed Care – PPO | Admitting: Internal Medicine

## 2012-02-23 ENCOUNTER — Encounter: Payer: Self-pay | Admitting: Internal Medicine

## 2012-02-23 ENCOUNTER — Ambulatory Visit (INDEPENDENT_AMBULATORY_CARE_PROVIDER_SITE_OTHER): Payer: BC Managed Care – PPO | Admitting: Internal Medicine

## 2012-02-23 ENCOUNTER — Telehealth: Payer: Self-pay | Admitting: Internal Medicine

## 2012-02-23 VITALS — BP 98/60 | HR 67 | Wt 174.6 lb

## 2012-02-23 DIAGNOSIS — N400 Enlarged prostate without lower urinary tract symptoms: Secondary | ICD-10-CM | POA: Insufficient documentation

## 2012-02-23 DIAGNOSIS — N429 Disorder of prostate, unspecified: Secondary | ICD-10-CM

## 2012-02-23 DIAGNOSIS — R972 Elevated prostate specific antigen [PSA]: Secondary | ICD-10-CM

## 2012-02-23 DIAGNOSIS — R413 Other amnesia: Secondary | ICD-10-CM

## 2012-02-23 DIAGNOSIS — F0390 Unspecified dementia without behavioral disturbance: Secondary | ICD-10-CM | POA: Insufficient documentation

## 2012-02-23 DIAGNOSIS — IMO0001 Reserved for inherently not codable concepts without codable children: Secondary | ICD-10-CM

## 2012-02-23 NOTE — Telephone Encounter (Signed)
This was documented in patients OV notes from today 02/23/12, patient walked back to the nursing station and we high-lighted the area that specifically stated patient to be out of work until pending appointments.

## 2012-02-23 NOTE — Telephone Encounter (Signed)
Patient's son states they need a work note for the patient to be out of work until his upcoming appts. They would like a note faxed to [phone] Attn: Principle Jacolyn Reedy

## 2012-02-23 NOTE — Telephone Encounter (Signed)
Spoke with Danelle Earthly, patient's son. I informed him of where to locate patient instructions

## 2012-02-23 NOTE — Progress Notes (Signed)
  Subjective:    Patient ID: Blake Wilcox, male    DOB: Feb 03, 1941, 72 y.o.   MRN: [id number]  HPI Diabetes status assessment: Fasting blood sugar range:on present meds 260s , pre insulin > 333 Average fasting blood sugar:no average Highest glucose 2 hours postmeal:360 Hypoglycemia:no (Note: on oral sulfonylurea & Januvia in addition to insulin 20 u daily Nutrition/diet:no plan Exercise:very physically active @ work , working 5 days/ week Eye exam:Ophth exam 1/10 , glasses deferred Foot exam:1/6 negative  Memory Loss:admits to memory loss  Onset:6 mos ago Progression/character:stable Severity:"I forget" when writing letter; get lost @ times. As per son can't find items in plain view B12 ,TFTs,        Review of Systems Weight change:stable Polydipsia/polyphagia/polyuria:no Eye  Symptoms(blurring/diplopia/vision loss):no Cardiovascular symptoms(chest pain/claudication/postural lightheadedness):no Neurologic symptoms (numbness/tingling/burning in extremities/syncope): Skin (lesions/ ulcers/ color change):no Constitutional:Fever/chills/ sweats /fatigue/ sleep issues: Endo: temp intolerance/ skin, hair, nail changes:no  PMH: head trauma/LOC:no  FH:no dementia            Objective:   Physical Exam Gen.: Thin but healthy and well-nourished in appearance. Alert, appropriate and cooperative throughout exam. Appears younger than stated age  Eyes: No corneal or conjunctival inflammation noted.  Neck: No deformities, masses, or tenderness noted.  Thyroid normal. Lungs: Normal respiratory effort; chest expands symmetrically. Lungs are clear to auscultation without rales, wheezes, or increased work of breathing. Heart: Normal rate and rhythm. Normal S1 and S2. No gallop, click, or rub. No  murmur. Abdomen: Bowel sounds normal; abdomen soft and nontender. No masses, organomegaly or hernias noted.      Digital rectal exam: Asymmetric prostatic hypertrophy; right lobe 2.5+  enlarged, left 1.5-2+ enlarged. No definite nodularity                             Musculoskeletal/extremities: No clubbing, cyanosis, edema, or significant extremity  deformity noted. Interosseous wasting with weakness L hand. Nail health good. Able to lie down & sit up w/o help.  Vascular: Carotid, radial artery, dorsalis pedis and  posterior tibial pulses are full and equal. No bruits present. Neurologic:  Deep tendon reflexes symmetrical and normal. Rhomberg & finger to nose testing with slight past pointing; initial difficulty comprehending instructions.Gait including heel & toe walking normal.        Skin: Intact without suspicious lesions or rashes. Lymph: No cervical, axillary lymphadenopathy present. Psych: Mood and affect are normal. Normally interactive        Mini-Mental Status exam was completed to evaluate the patient's concerns in reference to memory deficit. Results revealed:  21 of 30                                                                                   Assessment & Plan:

## 2012-02-23 NOTE — Patient Instructions (Addendum)
Review and correct the record as indicated. Please share record with all medical staff seen.  Please remain out of work until  Neurology & Urology evaluations completed. Do NOT drive .

## 2012-02-23 NOTE — Assessment & Plan Note (Signed)
CT without contrast will be completed. Neurology referral made. Unfortunately Blake Wilcox should not drive until the neurology evaluation is completed

## 2012-02-23 NOTE — Assessment & Plan Note (Signed)
Sulfonylurea will be discontinued. Insulin will be increased by 5 units each day if weekly FBS averages > 200 , up to a maximum of 50 units .

## 2012-02-23 NOTE — Assessment & Plan Note (Signed)
Urology assessment 

## 2012-02-24 ENCOUNTER — Telehealth: Payer: Self-pay | Admitting: Internal Medicine

## 2012-02-24 NOTE — Telephone Encounter (Signed)
I spoke with patient's wife, Mrs.Krienke would like specialty appointments set for Tuesday or Wed so that she and son Caryn Bee) may attend appointment. I informed her I will let Renee (referral coordinator) know.

## 2012-02-24 NOTE — Telephone Encounter (Signed)
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

## 2012-02-25 ENCOUNTER — Ambulatory Visit (INDEPENDENT_AMBULATORY_CARE_PROVIDER_SITE_OTHER)
Admission: RE | Admit: 2012-02-25 | Discharge: 2012-02-25 | Disposition: A | Discharge status: Home / Self Care | Payer: BC Managed Care – PPO | Source: Ambulatory Visit | Attending: Internal Medicine | Admitting: Internal Medicine

## 2012-02-25 DIAGNOSIS — R413 Other amnesia: Secondary | ICD-10-CM

## 2012-02-27 ENCOUNTER — Ambulatory Visit: Payer: BC Managed Care – PPO | Admitting: Internal Medicine

## 2012-03-04 ENCOUNTER — Encounter: Payer: Self-pay | Admitting: Internal Medicine

## 2012-03-05 ENCOUNTER — Ambulatory Visit (INDEPENDENT_AMBULATORY_CARE_PROVIDER_SITE_OTHER): Payer: BC Managed Care – PPO | Admitting: Internal Medicine

## 2012-03-05 ENCOUNTER — Encounter: Payer: Self-pay | Admitting: Internal Medicine

## 2012-03-05 VITALS — BP 112/68 | HR 75 | Wt 177.0 lb

## 2012-03-05 DIAGNOSIS — R413 Other amnesia: Secondary | ICD-10-CM

## 2012-03-05 NOTE — Progress Notes (Signed)
Subjective:    Patient ID: Blake Wilcox, male    DOB: December 06, 1940, 72 y.o.   MRN: [id number]  HPI  With basal insulin, Januvia, and metformin; his sugars have improved dramatically. FBS had been as high as 386 on 02/23/12. Over the last week glucoses have ranged from 119-227.No  Hypoglycemia on present regimen   The neurologist felt that the memory issues were related to the poorly controlled diabetes.  He is anxious to return to work & to begin driving.                                                                                                         . Nutrition/diet:  No plan.  Eye exam : current;Dr Nile Riggs. Foot care : current,Dr Sherilyn Cooter.        Review of Systems Excess thirst ;  Excess hunger ;  Excess urination:  no.                                  Lightheadedness with standing:  no. Chest pain ; Palpitations  ;  Pain in  calves with walking: no .                                                                                                                                 Non healing skin  ulcers or sores,especially over the feet: no. Numbness or tingling or burning in feet : no .                                                                                                                                              Significant change in  Weight :now  stable. Vision changes : less blurred  .  Objective:   Physical Exam Gen.:  well-nourished in appearance. Alert, appropriate and cooperative throughout exam.  Eyes: No corneal or conjunctival inflammation noted.  Mouth: Oral mucosa and oropharynx reveal no lesions or exudates. Teeth in poor repair.  Lungs: Normal respiratory effort; chest expands symmetrically. Lungs are clear to auscultation without rales, wheezes, or increased work of breathing. Heart: Normal rate and rhythm. Normal S1 and S2. No gallop, click, or rub. S4 w/o murmur.                                       Musculoskeletal/extremities:  No clubbing, cyanosis, edema, or significant extremity  deformity noted. Tone & strength  normal.Joints normal . Nail health good.  Vascular: Carotid, radial artery, dorsalis pedis and  posterior tibial pulses are full and equal. No bruits present. Neurologic: Alert and oriented x3.      Skin: Intact without suspicious lesions or rashes.  Psych: Mood and affect are normal. Normally interactive                                                                                         Assessment & Plan:

## 2012-03-05 NOTE — Patient Instructions (Addendum)
Goals for home glucose monitoring are : fasting  or morning glucose goal of  100-150. Two hours (from "first bite") after any meal , goal = < 180, preferably < 160.Any low blood glucoses should be reported immediately.  Monitor the glucose before eating  (fasting blood sugar) Monday, Wednesday, Friday, and Sunday.  Check glucose 2 hours after  breakfast on Tuesday; 2 hours after lunch on Thursday; and 2 hours after the meal on Saturday.   Stop Januvia if the fasting blood sugars are averaging less than 150. Recheck A1c and urine microalbumin in early April.PLEASE BRING THESE INSTRUCTIONS TO FOLLOW UP  LAB APPOINTMENT.This will guarantee correct labs are drawn, eliminating need for repeat blood sampling ( needle sticks ! ). Diagnoses /Codes: 250.02  You're cleared to resume driving. You may return to work 03/08/12.

## 2012-03-05 NOTE — Assessment & Plan Note (Signed)
Neurologist believes the memory issues were related to the severely uncontrolled diabetes. He is alert and oriented today. He'll be cleared to drive and return to work.

## 2012-03-05 NOTE — Assessment & Plan Note (Signed)
He will stay on 20 units of the basal insulin. The Januvia should be stopped if fasting blood sugars are averaging less than 150. A1c will be rechecked the first week of April.

## 2012-03-08 ENCOUNTER — Other Ambulatory Visit (INDEPENDENT_AMBULATORY_CARE_PROVIDER_SITE_OTHER): Payer: BC Managed Care – PPO

## 2012-03-08 DIAGNOSIS — Z1289 Encounter for screening for malignant neoplasm of other sites: Secondary | ICD-10-CM

## 2012-03-08 NOTE — Addendum Note (Signed)
Addended by: Tirth Cothron D on: 03/08/2012 03:31 PM   Modules accepted: Orders  

## 2012-03-08 NOTE — Addendum Note (Signed)
Addended by: Silvio Pate D on: 03/08/2012 03:31 PM   Modules accepted: Orders

## 2012-03-08 NOTE — Addendum Note (Signed)
Addended by: Silvio Pate D on: 03/08/2012 03:30 PM   Modules accepted: Orders

## 2012-03-21 ENCOUNTER — Encounter: Payer: Self-pay | Admitting: Internal Medicine

## 2012-03-31 ENCOUNTER — Telehealth: Payer: Self-pay

## 2012-03-31 NOTE — Telephone Encounter (Signed)
Message copied by Maurice Small on Wed Mar 31, 2012 10:13 AM ------      Message from: Pecola Lawless      Created: Sun Mar 21, 2012 11:30 AM       I reviewed Dr Ellin Goodie Urology note of 03/16/12. Apparently Blake Wilcox has been seeing Dr. Merry Lofty in [facility]. He did not relate this fact to me and we have no records in EMR from Dr. Merry Lofty.      He was evaluated 03/16/12 by Dr. Terrace Arabia from memory deficit. She blamed this on his extremely poorly controlled diabetes. This instance of him not informing me that he was seeing a Urologist on a regular basis makes me concerned that the memory issues are more than simply uncontrolled diabetes. Neurology follow up will be encouraged; follow-up appointment apparently has been scheduled.            Please send copy of office notes Dr. Terrace Arabia and Dr Legrand Pitts. ------

## 2012-03-31 NOTE — Telephone Encounter (Signed)
OV notes faxed to Dr.Yan @ [phone] and to Dr.Punchinsky @ [phone]

## 2012-05-12 ENCOUNTER — Telehealth: Payer: Self-pay | Admitting: Internal Medicine

## 2012-05-12 MED ORDER — INSULIN DETEMIR 100 UNIT/ML ~~LOC~~ SOLN: 20.0000 [IU] | Freq: Every day | SUBCUTANEOUS | Status: DC

## 2012-05-12 NOTE — Telephone Encounter (Signed)
Patient Information:  Caller Name: Mikey College  Phone: [phone]  Patient: Blake Wilcox, Blake Wilcox  Gender: Male  DOB: [birth year]  Age: 72 Years  PCP: Marga Melnick  Office Follow Up:  Does the office need to follow up with this patient?: Yes  Instructions For The Office: Please follow up with patient regarding the refill of the Levemir Insulin Pen (insulin detemir (LEVEMIR FLEXPEN) 100 UNIT/ML injection Sig - Route: Inject 20 Units into the skin at bedtime. - Subcutaneous) to [facility] at [phone]  RN Note:  Son calls for father regarding elevated blood sugars.  Was recently seen by Urologist who stated he needed to follow up with his blood sugars.  Patient stopped taking his insulin on 03/23/12 due to running out of medication.  No call was made to office to have the medication renewed or called in to the pharmacy of choice.  Prior to this blood sugars had been controlled with the two oral medications and insulin.  Son states that father has continued to eat as he had been instructed for his diabetes.  However without the insulin morning blood sugars are running roughly in the mid -250 range with after meal readings form 300 to over 400 +.  Per triage, except for values no emergent symptoms assessed.  Prior to stopping the insulin blood sugars had been at goal.  Care advice given with caller demonstrating his understanding.  Refill request for the insulin will be sent to the office.  Son transferred to office for scheduling of OV for 05/14/12.  Symptoms  Reason For Call & Symptoms: Urologist reccommended father to follow up with his blood sugar.  Reviewed Health History In EMR: Yes  Reviewed Medications In EMR: Yes  Reviewed Allergies In EMR: Yes  Reviewed Surgeries / Procedures: Yes  Date of Onset of Symptoms: 02/21/2012  Guideline(s) Used:  Diabetes - High Blood Sugar  Disposition Per Guideline:   Discuss with PCP and Callback by Nurse within 1 Hour  Reason For Disposition  Reached:   Blood glucose > 300 mg/dl (09.8 mmol/l) AND two or more times in a row  Advice Given:  Treatment - Liquids  Drink at least one glass (8 oz or 240 ml) of water per hour for the next 4 hours. (Reason: adequate hydration will reduce hyperglycemia).  Generally, you should try to drink 6-8 glasses of water each day.  Measure and Record Your Blood Glucose  Every day you should measure your blood glucose before breakfast and before going to bed.  Record the results and show them to your doctor at your next office visit.  Call Back If:  Blood glucose more than 300 mg/dL (11.9 mmol/l), 2 or more times in a row.  Urine ketones become moderate or large  Vomiting lasting more than 4 hours or unable to drink any liquids.  Rapid breathing occurs  You become worse.  Patient Will Follow Care Advice:  YES

## 2012-05-12 NOTE — Telephone Encounter (Signed)
RX sent to [facility] patient with pending appointment 05/17/2012. Will send to MD for review

## 2012-05-17 ENCOUNTER — Encounter: Payer: Self-pay | Admitting: Internal Medicine

## 2012-05-17 ENCOUNTER — Ambulatory Visit (INDEPENDENT_AMBULATORY_CARE_PROVIDER_SITE_OTHER): Payer: BC Managed Care – PPO | Admitting: Internal Medicine

## 2012-05-17 VITALS — BP 110/72 | HR 81 | Temp 98.2°F | Wt 178.0 lb

## 2012-05-17 DIAGNOSIS — IMO0001 Reserved for inherently not codable concepts without codable children: Secondary | ICD-10-CM

## 2012-05-17 NOTE — Patient Instructions (Addendum)
Check glucose once daily if possible Fasting or morning glucose recommended M, W, F, & Sun if possible. Goal= 100-150 Glucose 2 hours after breakfast Tues, after lunch Thurs & 2 hrs after eve meal Sat if possible. Goal = < 180 

## 2012-05-17 NOTE — Progress Notes (Signed)
  Subjective:    Patient ID: Blake Wilcox, male    DOB: [birth year], 72 y.o.   MRN: [id number]  HPI  His fasting blood sugars have been 250-260 despite taking Levemir 20 units each day; Januvia 100 mg daily; and metformin 500 mg twice daily.  Random glucose was 385 @ his urologist office on 05/11/12. Sodium was minimally reduced at 134; BUN 14; & creatinine 0.74.   He denies polyuria, polyphagia, polydipsia. Weight is stable. He has no hypoglycemia.  On 02/18/12 his hemoglobin A1c was 15.1%    Review of Systems   His urologist is monitoring his PSA; the value was 7.12 on 05/11/12.     Objective:   Physical Exam Appears healthy and well-nourished & in no acute distress  No carotid bruits are present.No neck pain distention present at 10 - 15 degrees. Thyroid normal to palpation  Heart rhythm and rate are normal with no significant murmurs or gallops.  Chest is clear with no increased work of breathing  There is no evidence of aortic aneurysm or renal artery bruits  Abdomen soft with no organomegaly or masses. No HJR  No clubbing, cyanosis or edema present.  Pedal pulses are intact   No ischemic skin changes are present . Nails thickened & pigmented  Alert and oriented. Strength, tone, DTRs reflexes normal. Light touch normal over feet          Assessment & Plan:

## 2012-05-17 NOTE — Assessment & Plan Note (Signed)
A1c and urine microalbumin will be rechecked. I anticipate Levemir will be increased to 30 units. We may need to refocus on postprandial glucoses for optimal control.

## 2012-05-18 LAB — MICROALBUMIN / CREATININE URINE RATIO: Microalb, Ur: 4.6 mg/dL — ABNORMAL HIGH (ref 0.0–1.9)

## 2012-05-18 LAB — HEMOGLOBIN A1C: Hgb A1c MFr Bld: 10.7 % — ABNORMAL HIGH (ref 4.6–6.5)

## 2012-07-03 ENCOUNTER — Other Ambulatory Visit: Payer: Self-pay | Admitting: Internal Medicine

## 2012-07-06 NOTE — Telephone Encounter (Signed)
I spoke with patient's wife, not sure of medications. Patient to call prior to going to work tomorrow to discuss medications. Requested medication not on med list

## 2012-07-07 NOTE — Telephone Encounter (Signed)
Patient's wife is calling back before patient leaves for work about his medications.

## 2012-07-08 NOTE — Telephone Encounter (Signed)
Left message on voicemail for patient to return call when available to discuss medications. If I am unavailable it is ok for the patient to speak with any clinical team member

## 2012-07-09 NOTE — Telephone Encounter (Signed)
I spoke with patient/wife and patient checked medications: patient is using insulin, metformin and Avodart. No other medications at this time, this was sent in error

## 2012-08-03 ENCOUNTER — Encounter: Payer: Self-pay | Admitting: Nurse Practitioner

## 2012-08-03 ENCOUNTER — Ambulatory Visit (INDEPENDENT_AMBULATORY_CARE_PROVIDER_SITE_OTHER): Payer: BC Managed Care – PPO | Admitting: Nurse Practitioner

## 2012-08-03 VITALS — BP 113/65 | HR 65 | Ht 74.0 in | Wt 182.0 lb

## 2012-08-03 DIAGNOSIS — R413 Other amnesia: Secondary | ICD-10-CM

## 2012-08-03 NOTE — Patient Instructions (Addendum)
Mild Cognitive impairment Continue to maximize his blood glucose control Mini-Mental Status exam is stable Followup in 6 months

## 2012-08-03 NOTE — Progress Notes (Signed)
HPI: Patient returns for followup after initial evaluation with Dr. Terrace Arabia 03/02/2012 for memory loss. He had a past medical history of type 2 diabetes for a few years, was on oral medications, immigrated from [place] in [place], had a high school diploma, he worked at [employer] job, currently working as a Copy for school system, still driving, live with his wife and son. In December 2013, he had excessive polyuria, he urinate every 15 minutes, went to Dr. Alwyn Ren, was found to have elevated glucose, A1c 15.1, he was started on diabetes medications, but since that time, he was also noticed to have mild short-term memory trouble, he could not remember where he puts things, but he is still able to drive, and go to work, he denies visual loss, no lateralized motor or sensory deficit, CT head was normal. Normal TSH, B12. On return visit today patient states that his blood sugars are in much better control and he feels his memory is slowly getting better. He is accompanied by his son who also agrees with that statement. Mini-Mental Status exam is stable  ROS:  Weight loss, memory loss  Physical Exam General: well developed, well nourished, seated, in no evident distress Head: head normocephalic and atraumatic. Oropharynx benign Neck: supple with no carotid  bruits Cardiovascular: regular rate and rhythm, no murmurs  Neurologic Exam Mental Status: Awake and fully alert. Oriented to place and time. Follows all commands. Speech and language normal.MMSE 27/30 missing 3/3 recall.  AFT =9.    Cranial Nerves: Fundoscopic exam reveals sharp disc margins. Pupils equal, briskly reactive to light. Extraocular movements full without nystagmus. Visual fields full to confrontation. Hearing intact and symmetric to finger snap. Facial sensation intact. Face, tongue, palate move normally and symmetrically. Neck flexion and extension normal.  Motor: Left hand muscle atrophy, moderate finger abduction weakness . Normal  strength in all other tested extremity muscles. Coordination: Rapid alternating movements normal in all extremities. Finger-to-nose and heel-to-shin performed accurately bilaterally. Gait and Station: Arises from chair without difficulty. Stance is normal.  Able to heel, toe and tandem walk without difficulty. No assistive device  Reflexes: 2+ and symmetric except Achilles 1+. Toes downgoing.     ASSESSMENT: Memory loss which correlated with a sudden surge of blood sugar with  A1c of 15. CAT scan of the brain was normal Mini-Mental Status exam stable 27/30, he did miss 3 of 3 recall.     PLAN: Mild Cognitive impairment improved per patient and son Continue to maximize his blood glucose control Mini-Mental Status exam is stable Followup in 6 months  Nilda Riggs, GNP-BC APRN

## 2012-09-15 ENCOUNTER — Other Ambulatory Visit: Payer: Self-pay

## 2012-12-14 ENCOUNTER — Ambulatory Visit (INDEPENDENT_AMBULATORY_CARE_PROVIDER_SITE_OTHER): Payer: BC Managed Care – PPO | Admitting: Internal Medicine

## 2012-12-14 ENCOUNTER — Encounter: Payer: Self-pay | Admitting: Internal Medicine

## 2012-12-14 ENCOUNTER — Other Ambulatory Visit: Payer: Self-pay | Admitting: Internal Medicine

## 2012-12-14 VITALS — BP 146/70 | HR 64 | Temp 98.5°F | Resp 14

## 2012-12-14 DIAGNOSIS — E785 Hyperlipidemia, unspecified: Secondary | ICD-10-CM

## 2012-12-14 DIAGNOSIS — E1159 Type 2 diabetes mellitus with other circulatory complications: Secondary | ICD-10-CM

## 2012-12-14 DIAGNOSIS — E119 Type 2 diabetes mellitus without complications: Secondary | ICD-10-CM | POA: Insufficient documentation

## 2012-12-14 DIAGNOSIS — I10 Essential (primary) hypertension: Secondary | ICD-10-CM

## 2012-12-14 LAB — BASIC METABOLIC PANEL
BUN: 14 mg/dL (ref 6–23)
CO2: 28 mEq/L (ref 19–32)
Chloride: 101 mEq/L (ref 96–112)
Creatinine, Ser: 0.8 mg/dL (ref 0.4–1.5)
Glucose, Bld: 219 mg/dL — ABNORMAL HIGH (ref 70–99)
Potassium: 4.1 mEq/L (ref 3.5–5.1)

## 2012-12-14 LAB — MICROALBUMIN / CREATININE URINE RATIO: Microalb Creat Ratio: 1 mg/g (ref 0.0–30.0)

## 2012-12-14 LAB — ALT: ALT: 30 U/L (ref 0–53)

## 2012-12-14 LAB — LIPID PANEL
HDL: 69 mg/dL (ref >39.00)
Total CHOL/HDL Ratio: 3
Triglycerides: 109 mg/dL (ref 0.0–149.0)

## 2012-12-14 LAB — TSH: TSH: 1.38 u[IU]/mL (ref 0.35–5.50)

## 2012-12-14 LAB — AST: AST: 21 U/L (ref 0–37)

## 2012-12-14 LAB — HEMOGLOBIN A1C: Hgb A1c MFr Bld: 7.9 % — ABNORMAL HIGH (ref 4.6–6.5)

## 2012-12-14 MED ORDER — RAMIPRIL 2.5 MG PO CAPS: 2.5000 mg | ORAL_CAPSULE | Freq: Every day | ORAL | Status: DC

## 2012-12-14 MED ORDER — INSULIN DETEMIR 100 UNIT/ML ~~LOC~~ SOLN: 20.0000 [IU] | Freq: Every day | SUBCUTANEOUS | Status: DC

## 2012-12-14 NOTE — Assessment & Plan Note (Signed)
BMET, A1c & urine microalbumin . Nutritional & exercise interventions as discussed.

## 2012-12-14 NOTE — Progress Notes (Signed)
Subjective:    Patient ID: Blake Wilcox, male    DOB: Apr 07, 1940, 72 y.o.   MRN: [id number]  HPI Diabetes status assessment: Fasting blood sugars have varied from a low of 96-high of 509. This was an aberration. The other high noted was 243.  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 No specific nutrition/diet followed Medication compliance is good. No medication adverse effects noted. Eye exam current; ? retinopathy. Foot care  current  A1c/ urine microalbumin monitor  05/17/12 : 10.7/4.6 (average sugar 300 with long-term 74 %  risk ).  Lipids have not been checked since 2012. They were at goal at that time. He has never taken a statin. He is not on a baby aspirin.  BP @ home ; no recording of readings.        Review of Systems  No excess thirst ;  excess hunger ; or excess urination reported                              No lightheadedness with standing reported No chest pain ; palpitations ; claudication described .                                                                                                                         No non healing skin  ulcers or sores of extremities noted. No numbness or tingling or burning in feet described  No significant change in weight . No blurred,double, or loss of vision reported  .        Objective:   Physical Exam  Gen.: Thin but healthy and well-nourished in appearance. Alert, appropriate and cooperative throughout exam.Appears younger than stated age  Head: Normocephalic without obvious abnormalities;  no alopecia  Eyes: No corneal or conjunctival inflammation noted.  Extraocular motion intact.    Mouth: Oral mucosa and oropharynx reveal no lesions or exudates. Teeth in fair- poor repair. Neck: No deformities, masses, or tenderness noted.  Thyroid asymmetric , L > R lobe. Lungs: Normal respiratory effort; chest expands symmetrically. Lungs are clear to auscultation without rales, wheezes, or increased work of breathing. Heart: Normal rate and rhythm. Normal S1 and S2. No gallop, click, or rub. Grade 1/6 systolic murmur @ apex. Abdomen: Bowel sounds normal; abdomen soft and nontender. No masses, organomegaly or hernias noted.                                Musculoskeletal/extremities: No deformity or scoliosis noted of  the thoracic or lumbar spine.   No clubbing, cyanosis, edema, or significant extremity  deformity noted. Range of motion normal .Tone & strength normal. Hand joints normal . Fingernail / toenail health :hyperpigmented & thickened. Able to lie down & sit up w/o help. Negative SLR bilaterally Vascular: Carotid, radial artery, dorsalis pedis and  posterior tibial pulses are  Equal.Decreased pedal pulses especially PTP. No bruits present. Neurologic: Alert and oriented x3. Deep tendon reflexes symmetrical and normal. Light touch  Gait normal        Skin: Intact without suspicious lesions or rashes. Lymph: No cervical, axillary lymphadenopathy present. Psych: Mood and affect are normal. Normally interactive                                                                                        Assessment & Plan:  See Current Assessment & Plan in Problem List under specific Diagnosis

## 2012-12-14 NOTE — Patient Instructions (Signed)
Your next office appointment will be determined based upon review of your pending labs . Those instructions will be transmitted to you  by mail.  Please report any significant change in your symptoms. Caries ( cavities) and plaque on the teeth can lead to significant local and systemic infections with threat to your health.Please have dental care completed as soon as possible. Minimal Blood Pressure Goal= AVERAGE < 140/90;  Ideal is an AVERAGE < 135/85. This AVERAGE should be calculated from @ least 5-7 BP readings taken @ different times of day on different days of week. You should not respond to isolated BP readings , but rather the AVERAGE for that week .Please bring your  blood pressure cuff to office visits to verify that it is reliable.It  can also be checked against the blood pressure device at the pharmacy. Finger or wrist cuffs are not dependable; an arm cuff is.

## 2012-12-14 NOTE — Assessment & Plan Note (Signed)
BMET  Ramipril

## 2012-12-14 NOTE — Assessment & Plan Note (Signed)
Non fasting lipids , AST,ALT

## 2012-12-21 ENCOUNTER — Encounter: Payer: Self-pay | Admitting: Internal Medicine

## 2013-02-01 ENCOUNTER — Ambulatory Visit (INDEPENDENT_AMBULATORY_CARE_PROVIDER_SITE_OTHER): Payer: Medicare Other | Admitting: Nurse Practitioner

## 2013-02-01 ENCOUNTER — Encounter: Payer: Self-pay | Admitting: Nurse Practitioner

## 2013-02-01 VITALS — BP 110/80 | HR 67 | Ht 72.75 in | Wt 182.0 lb

## 2013-02-01 DIAGNOSIS — R413 Other amnesia: Secondary | ICD-10-CM

## 2013-02-01 MED ORDER — DONEPEZIL HCL 5 MG PO TABS: 5.0000 mg | ORAL_TABLET | Freq: Every day | ORAL | Status: DC

## 2013-02-01 NOTE — Patient Instructions (Signed)
Will add Aricept 5mg  daily for 1 month then 2 tabs daily F/U in 6 months

## 2013-02-01 NOTE — Progress Notes (Signed)
[facility]  PATIENT: Blake Wilcox DOB: [birth year]   REASON FOR VISIT: memory loss   HISTORY OF PRESENT ILLNESS: Mr. Roker, 72 year old black male returns for followup. He has mild short-term memory problems. When last seen his Mini-Mental Status exam score was stable however he dropped a few points today. He says he stopped working about 3 months ago, he is currently living with his son who does the cooking and manages his medications. He feels like his memory is about the same. He states his diabetes is in better control. He denies any visual loss motor or sensory deficits. He has not fallen. Sleeping well at night.   HISTORY: He had a past medical history of type 2 diabetes for a few years, was on oral medications, immigrated from [place] in 1973, had a high school diploma, he worked at [employer] job, currently working as a Copy for school system, still driving, live with his wife and son.  In December 2013, he had excessive polyuria, he urinate every 15 minutes, went to Dr. Alwyn Ren, was found to have elevated glucose, A1c 15.1, he was started on diabetes medications, but since that time, he was also noticed to have mild short-term memory trouble, he could not remember where he puts things, but he is still able to drive, and go to work, he denies visual loss, no lateralized motor or sensory deficit,  CT head was normal. Normal TSH, B12.  On return visit today patient states that his blood sugars are in much better control and he feels his memory is slowly getting better. He is accompanied by his son who also agrees with that statement. Mini-Mental Status exam is    REVIEW OF SYSTEMS: Full 14 system review of systems performed and notable only for those listed, all others are neg:  Constitutional: N/A  Cardiovascular: N/A  Ear/Nose/Throat: N/A  Skin: N/A  Eyes: N/A  Respiratory: N/A  Gastroitestinal: N/A  Hematology/Lymphatic: N/A  Endocrine:  N/A Musculoskeletal:N/A  Allergy/Immunology: N/A  Neurological: Memory loss, headache Psychiatric: N/A   ALLERGIES: No Known Allergies  HOME MEDICATIONS: Outpatient Prescriptions Prior to Visit  Medication Sig Dispense Refill  . dutasteride (AVODART) 0.5 MG capsule Take 0.5 mg by mouth daily.      Marland Kitchen glucose blood (ONE TOUCH ULTRA TEST) test strip 1 each by Other route. Check blood sugar daily as directed, DX 250.02  100 each  3  . insulin detemir (LEVEMIR) 100 UNIT/ML injection Inject 0.2 mLs (20 Units total) into the skin at bedtime.  3 mL  2  . metFORMIN (GLUCOPHAGE-XR) 500 MG 24 hr tablet TAKE 1 TABLET BY MOUTH TWICE  DAILY BEFORE MEALS  60 tablet  0  . ONETOUCH DELICA LANCETS FINE MISC 1 Units by Does not apply route as directed. Check blood sugar daily as directed, DX:250.02  100 each  3  . ramipril (ALTACE) 2.5 MG capsule Take 1 capsule (2.5 mg total) by mouth daily.  30 capsule  5   No facility-administered medications prior to visit.    PAST MEDICAL HISTORY: Past Medical History  Diagnosis Date  . Diabetes mellitus     TYPE 2  . Elevated PSA   . FH: colonic polyps   . Anemia   . Hyperlipidemia   . Vegetarian   . Memory loss     PAST SURGICAL HISTORY: Past Surgical History  Procedure Laterality Date  . Hemorrhoid surgery    . Colonoscopy w/ polypectomy    . Cataract extraction    .  Foreign body os      FAMILY HISTORY: Family History  Problem Relation Age of Onset  . Cancer Sister     BREAST  . Diabetes Maternal Aunt   . Stroke Neg Hx   . Hearing loss Neg Hx     SOCIAL HISTORY: History   Social History  . Marital Status: Married    Spouse Name: Delrose    Number of Children: 1  . Years of Education: 12   Occupational History  . SCHOOL SYSTEM; CUSTODIAL    Social History Main Topics  . Smoking status: Never Smoker   . Smokeless tobacco: Never Used  . Alcohol Use: No  . Drug Use: No  . Sexual Activity: Not on file   Other Topics Concern   . Not on file   Social History Narrative   PT.DOES EXERCISE   VEGETARIAN      Patient lives at home with his wife (Delrose). Patient is retired. Patient has 12th grade education.   Right handed.   Caffeine - one cup daily.   Patient has one child.     PHYSICAL EXAM  Filed Vitals:   02/01/13 0821  BP: 97/58  Pulse: 67  Height: 6' 0.75" (1.848 m)  Weight: 182 lb (82.555 kg)   Body mass index is 24.17 kg/(m^2).  Generalized: Well developed, in no acute distress  Head: normocephalic and atraumatic,. Oropharynx benign  Neck: Supple, no carotid bruits  Cardiac: Regular rate rhythm, no murmur  Musculoskeletal: No deformity   Neurological examination   Mentation: Alert oriented to time, place, history taking. MMSE 23/30 testing items on orientation calculation and 3 of 3 recall .AFT 11.Follows all commands speech and language fluent  Cranial nerve II-XII: Pupils were equal round reactive to light extraocular movements were full, visual field were full on confrontational test. Facial sensation and strength were normal. hearing was intact to finger rubbing bilaterally. Uvula tongue midline. head turning and shoulder shrug were normal and symmetric.Tongue protrusion into cheek strength was normal. Motor: normal bulk and tone, full strength in the BUE, BLE, fine finger movements normal, no pronator drift. No focal weakness Coordination: finger-nose-finger, heel-to-shin bilaterally, no dysmetria Reflexes: Brachioradialis 2/2, biceps 2/2, triceps 2/2, patellar 2/2, Achilles 1/1, plantar responses were flexor bilaterally. Gait and Station: Rising up from seated position without assistance, normal stance,  moderate stride, good arm swing, smooth turning, able to perform tiptoe, and heel walking without difficulty. Tandem gait is steady. No assistive device  DIAGNOSTIC DATA (LABS, IMAGING, TESTING) - I reviewed patient records, labs, notes, testing and imaging myself where available.  Lab  Results  Component Value Date   WBC 3.7* 02/18/2012   HGB 13.4 02/18/2012   HCT 39.9 02/18/2012   MCV 91.0 02/18/2012   PLT 207.0 02/18/2012      Component Value Date/Time   NA 134* 12/14/2012 1013   K 4.1 12/14/2012 1013   CL 101 12/14/2012 1013   CO2 28 12/14/2012 1013   GLUCOSE 219* 12/14/2012 1013   BUN 14 12/14/2012 1013   CREATININE 0.8 12/14/2012 1013   CALCIUM 9.4 12/14/2012 1013   PROT 7.5 12/02/2010 1035   ALBUMIN 4.0 12/02/2010 1035   AST 21 12/14/2012 1013   ALT 30 12/14/2012 1013   ALKPHOS 53 12/02/2010 1035   BILITOT 0.4 12/02/2010 1035   GFRNONAA 145.01 04/26/2009 0851   GFRAA 146 11/17/2007 0000   Lab Results  Component Value Date   CHOL 179 12/14/2012   HDL 69.00 12/14/2012  LDLCALC 88 12/14/2012   TRIG 109.0 12/14/2012   CHOLHDL 3 12/14/2012   Lab Results  Component Value Date   HGBA1C 7.9* 12/14/2012   Lab Results  Component Value Date   VITAMINB12 547 02/18/2012   Lab Results  Component Value Date   TSH 1.38 12/14/2012      ASSESSMENT AND PLAN  73 y.o. year old male  has a past medical history of Diabetes mellitus; Anemia; Hyperlipidemia; ; and Memory loss. here to followup. MMSE 23/30 missing all items in recall  Will add Aricept 5mg  daily for 1 month then 2 tabs daily F/U in 6 months Nilda Riggs, [facility], [facility], APRN  [facility] [address], [address] [place], [place] [address] [phone]

## 2013-02-25 NOTE — Progress Notes (Signed)
I reviewed note and agree with plan.   Suanne MarkerVIKRAM R. Numa Heatwole, MD 02/25/2013, 1:53 PM Certified in Neurology, Neurophysiology and Neuroimaging  [facility] [address], [address] [place], [place] [address] [phone]

## 2013-05-31 ENCOUNTER — Other Ambulatory Visit: Payer: Self-pay | Admitting: Internal Medicine

## 2013-08-02 ENCOUNTER — Encounter: Payer: Self-pay | Admitting: Nurse Practitioner

## 2013-08-02 ENCOUNTER — Ambulatory Visit (INDEPENDENT_AMBULATORY_CARE_PROVIDER_SITE_OTHER): Payer: Medicare PPO | Admitting: Nurse Practitioner

## 2013-08-02 VITALS — BP 104/61 | HR 53 | Ht 72.75 in | Wt 166.0 lb

## 2013-08-02 DIAGNOSIS — R413 Other amnesia: Secondary | ICD-10-CM

## 2013-08-02 MED ORDER — DONEPEZIL HCL 10 MG PO TABS: 10.0000 mg | ORAL_TABLET | Freq: Every day | ORAL | Status: DC

## 2013-08-02 NOTE — Patient Instructions (Signed)
Continue Aricept 10 mg daily  Follow up in 6 months

## 2013-08-02 NOTE — Progress Notes (Signed)
[facility]  PATIENT: Blake Blake Wilcox DOB: 1940-06-23   REASON FOR VISIT: memory loss   HISTORY OF PRESENT ILLNESS: Blake Wilcox, 73 year old male returns for followup.He has mild short-term memory problems. He says he stopped working about 9 months ago, he is currently living with his son who does the cooking and manages his medications. He feels like his memory is about the same. He states his diabetes is in better control. He denies any visual loss motor or sensory deficits. He has not fallen. Sleeping well at night. Appetite is good, no side effects to Aricept. 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  In December 2013, he had excessive polyuria, he urinate every 15 minutes, went to Dr. Alwyn Wilcox, was found to have elevated glucose, A1c 15.1, he was started on diabetes medications, but since that time, he was also noticed to have mild short-term memory trouble, he could not remember where he puts things, but he is still able to drive, and go to work, he denies visual loss, no lateralized motor or sensory deficit,  CT head was normal. Normal TSH, B12.   REVIEW OF SYSTEMS: Full 14 system review of systems performed and notable only for those listed, all others are neg:  Constitutional: N/A  Cardiovascular: N/A  Ear/Nose/Throat: N/A  Skin: N/A  Eyes: N/A  Respiratory: N/A  Gastroitestinal: N/A  Hematology/Lymphatic: N/A  Endocrine: N/A Musculoskeletal:N/A  Allergy/Immunology: N/A  Neurological: Memory loss  Psychiatric: N/A Sleep : NA   ALLERGIES: No Known Allergies  HOME MEDICATIONS: Outpatient Prescriptions Prior to Visit  Medication Sig Dispense Refill  . donepezil (ARICEPT) 5 MG  tablet Take 1 tablet (5 mg total) by mouth daily with breakfast. 1 tab daily for 1 month then increase to 2 tabs daily  60 tablet  5  . dutasteride (AVODART) 0.5 MG capsule Take 0.5 mg by mouth daily.      . insulin detemir (LEVEMIR) 100 UNIT/ML injection Inject 0.2 mLs (20 Units total) into the skin at bedtime.  3 mL  2  . metFORMIN (GLUCOPHAGE-XR) 500 MG 24 hr tablet TAKE 1 TABLET BY MOUTH TWICE  DAILY BEFORE MEALS  60 tablet  0  . ONE TOUCH ULTRA TEST test strip CHECK BLOOD SUGAR DAILY AS DIRECTED  100 each  5  . ONETOUCH DELICA LANCETS FINE MISC 1 Units by Does not apply route as directed. Check blood sugar daily as directed, DX:250.02  100 each  3  . ramipril (ALTACE) 2.5 MG capsule Take 1 capsule (2.5 mg total) by mouth daily.  30 capsule  5   No facility-administered medications prior to visit.    PAST MEDICAL HISTORY: Past Medical History  Diagnosis Date  . Diabetes mellitus     TYPE 2  . Elevated PSA   . FH: colonic polyps   . Anemia   . Hyperlipidemia   . Vegetarian   . Memory loss     PAST SURGICAL HISTORY: Past Surgical History  Procedure Laterality Date  . Hemorrhoid surgery    . Colonoscopy w/ polypectomy    . Cataract extraction    . Foreign body os      FAMILY HISTORY: Family History  Problem Relation Age of Onset  .  Cancer Sister     BREAST  . Diabetes Maternal Aunt   . Stroke Neg Hx   . Hearing loss Neg Hx     SOCIAL HISTORY: History   Social History  . Marital Status: Married    Spouse Name: Blake Wilcox    Number of Children: 1  . Years of Education: 12   Occupational History  . SCHOOL SYSTEM; CUSTODIAL    Social History Main Topics  . Smoking status: Never Smoker   . Smokeless tobacco: Never Used  . Alcohol Use: No  . Drug Use: No  . Sexual Activity: Not on file   Other Topics Concern  . Not on file   Social History Narrative   PT.DOES EXERCISE   VEGETARIAN      Patient lives at home with his wife (Blake Wilcox). Patient is retired.  Patient has 12th grade education.   Right handed.   Caffeine - one cup daily.   Patient has one child.     PHYSICAL EXAM  Filed Vitals:   08/02/13 0827  BP: 104/61  Pulse: 53  Height: 6' 0.75" (1.848 m)  Weight: 166 lb (75.297 kg)   Body mass index is 22.05 kg/(m^2). Generalized: Well developed, in no acute distress  Head: normocephalic and atraumatic,. Oropharynx benign  Neck: Supple, no carotid bruits  Cardiac: Regular rate rhythm, no murmur  Musculoskeletal: No deformity  Neurological examination  Mentation: Alert oriented to time, place, history taking. MMSE 24/30 missing  items on orientation, calculation and 3 of 3 recall ,unable to copy a figure. AFT 10.Follows all commands speech and language fluent  Cranial nerve II-XII: Pupils were equal round reactive to light extraocular movements were full, visual field were full on confrontational test. Facial sensation and strength were normal. hearing was intact to finger rubbing bilaterally. Uvula tongue midline. head turning and shoulder shrug were normal and symmetric.Tongue protrusion into cheek strength was normal.  Motor: normal bulk and tone, full strength in the BUE, BLE, fine finger movements normal, no pronator drift. No focal weakness  Coordination: finger-nose-finger, heel-to-shin bilaterally, no dysmetria  Reflexes: Brachioradialis 2/2, biceps 2/2, triceps 2/2, patellar 2/2, Achilles 1/1, plantar responses were flexor bilaterally.  Gait and Station: Rising up from seated position without assistance, normal stance, moderate stride, good arm swing, smooth turning, able to perform tiptoe, and heel walking without difficulty. Tandem gait is steady. No assistive device  DIAGNOSTIC DATA (LABS, IMAGING, TESTING) - I reviewed patient records, labs, notes, testing and imaging myself where available.      Component Value Date/Time   NA 134* 12/14/2012 1013   K 4.1 12/14/2012 1013   CL 101 12/14/2012 1013   CO2 28 12/14/2012 1013     GLUCOSE 219* 12/14/2012 1013   BUN 14 12/14/2012 1013   CREATININE 0.8 12/14/2012 1013   CALCIUM 9.4 12/14/2012 1013   PROT 7.5 12/02/2010 1035   ALBUMIN 4.0 12/02/2010 1035   AST 21 12/14/2012 1013   ALT 30 12/14/2012 1013   ALKPHOS 53 12/02/2010 1035   BILITOT 0.4 12/02/2010 1035   GFRNONAA 145.01 04/26/2009 0851   GFRAA 146 11/17/2007 0000   Lab Results  Component Value Date   CHOL 179 12/14/2012   HDL 69.00 12/14/2012   LDLCALC 88 12/14/2012   TRIG 109.0 12/14/2012   CHOLHDL 3 12/14/2012   Lab Results  Component Value Date   HGBA1C 7.9* 12/14/2012    Lab Results  Component Value Date   TSH 1.38 12/14/2012  ASSESSMENT AND PLAN  73 y.o. year old male  has a past medical history of Diabetes mellitus; Elevated PSA;  Anemia; Hyperlipidemia;  and Memory loss. here to followup.MMSE 24/30.  Continue Aricept 10mg  daily Follow up in 6 months Nilda RiggsNancy Carolyn Martin, [facility]NP, [facility], APRN  [facility] [address], [address] [place], [place] [address] [phone]) [phone]

## 2013-10-18 ENCOUNTER — Telehealth: Payer: Self-pay | Admitting: Internal Medicine

## 2013-10-18 NOTE — Telephone Encounter (Signed)
Patients wife states Dr. Alwyn Ren referred them to another PCP since he left [facility] but there is nothing noted in last ov.  Patient is needing to know who he referred them to.

## 2013-10-18 NOTE — Telephone Encounter (Signed)
[facility] is now closed.  Patient is having an issue and would like to be seen as soon as possible.  I told wife they could see Dr. Alwyn Ren here at [facility] until he retired.

## 2013-10-18 NOTE — Telephone Encounter (Signed)
The [facility] office would be closest to him

## 2013-10-24 ENCOUNTER — Encounter: Payer: Self-pay | Admitting: Internal Medicine

## 2013-10-24 ENCOUNTER — Other Ambulatory Visit (INDEPENDENT_AMBULATORY_CARE_PROVIDER_SITE_OTHER): Payer: BC Managed Care – PPO

## 2013-10-24 ENCOUNTER — Ambulatory Visit (INDEPENDENT_AMBULATORY_CARE_PROVIDER_SITE_OTHER): Payer: Medicare PPO | Admitting: Internal Medicine

## 2013-10-24 VITALS — BP 112/70 | HR 63 | Temp 97.9°F | Resp 14 | Wt 170.0 lb

## 2013-10-24 DIAGNOSIS — I10 Essential (primary) hypertension: Secondary | ICD-10-CM

## 2013-10-24 DIAGNOSIS — M5416 Radiculopathy, lumbar region: Secondary | ICD-10-CM | POA: Insufficient documentation

## 2013-10-24 DIAGNOSIS — E785 Hyperlipidemia, unspecified: Secondary | ICD-10-CM

## 2013-10-24 DIAGNOSIS — IMO0002 Reserved for concepts with insufficient information to code with codable children: Secondary | ICD-10-CM

## 2013-10-24 DIAGNOSIS — Z860100 Personal history of colon polyps, unspecified: Secondary | ICD-10-CM

## 2013-10-24 DIAGNOSIS — E1159 Type 2 diabetes mellitus with other circulatory complications: Secondary | ICD-10-CM

## 2013-10-24 LAB — BASIC METABOLIC PANEL
BUN: 10 mg/dL (ref 6–23)
CO2: 28 meq/L (ref 19–32)
CREATININE: 0.7 mg/dL (ref 0.4–1.5)
Calcium: 9.4 mg/dL (ref 8.4–10.5)
Chloride: 104 mEq/L (ref 96–112)
GFR: 147.96 mL/min (ref >60.00)
Glucose, Bld: 119 mg/dL — ABNORMAL HIGH (ref 70–99)
POTASSIUM: 4.4 meq/L (ref 3.5–5.1)
Sodium: 140 mEq/L (ref 135–145)

## 2013-10-24 LAB — CBC WITH DIFFERENTIAL/PLATELET
BASOS PCT: 0.9 % (ref 0.0–3.0)
Basophils Absolute: 0 10*3/uL (ref 0.0–0.1)
EOS PCT: 5 % (ref 0.0–5.0)
Eosinophils Absolute: 0.1 10*3/uL (ref 0.0–0.7)
HCT: 38.6 % — ABNORMAL LOW (ref 39.0–52.0)
Hemoglobin: 13.2 g/dL (ref 13.0–17.0)
Lymphocytes Relative: 43.2 % (ref 12.0–46.0)
Lymphs Abs: 1.2 10*3/uL (ref 0.7–4.0)
MCHC: 34.3 g/dL (ref 30.0–36.0)
MCV: 93.6 fl (ref 78.0–100.0)
MONO ABS: 0.3 10*3/uL (ref 0.1–1.0)
Monocytes Relative: 10.1 % (ref 3.0–12.0)
NEUTROS ABS: 1.1 10*3/uL — AB (ref 1.4–7.7)
NEUTROS PCT: 40.8 % — AB (ref 43.0–77.0)
Platelets: 195 10*3/uL (ref 150.0–400.0)
RBC: 4.13 Mil/uL — ABNORMAL LOW (ref 4.22–5.81)
RDW: 13 % (ref 11.5–15.5)
WBC: 2.7 10*3/uL — AB (ref 4.0–10.5)

## 2013-10-24 LAB — HEPATIC FUNCTION PANEL
ALT: 32 U/L (ref 0–53)
AST: 23 U/L (ref 0–37)
Albumin: 3.8 g/dL (ref 3.5–5.2)
Alkaline Phosphatase: 62 U/L (ref 39–117)
Bilirubin, Direct: 0.1 mg/dL (ref 0.0–0.3)
Total Bilirubin: 0.6 mg/dL (ref 0.2–1.2)
Total Protein: 7.3 g/dL (ref 6.0–8.3)

## 2013-10-24 LAB — MICROALBUMIN / CREATININE URINE RATIO
Creatinine,U: 83.4 mg/dL
MICROALB UR: 2.7 mg/dL — AB (ref 0.0–1.9)
MICROALB/CREAT RATIO: 3.2 mg/g (ref 0.0–30.0)

## 2013-10-24 LAB — LIPID PANEL
CHOLESTEROL: 151 mg/dL (ref 0–200)
HDL: 59.5 mg/dL (ref >39.00)
LDL CALC: 76 mg/dL (ref 0–99)
NonHDL: 91.5
Total CHOL/HDL Ratio: 3
Triglycerides: 77 mg/dL (ref 0.0–149.0)
VLDL: 15.4 mg/dL (ref 0.0–40.0)

## 2013-10-24 LAB — HEMOGLOBIN A1C: HEMOGLOBIN A1C: 6.6 % — AB (ref 4.6–6.5)

## 2013-10-24 LAB — TSH: TSH: 3.06 u[IU]/mL (ref 0.35–4.50)

## 2013-10-24 MED ORDER — GABAPENTIN 100 MG PO CAPS: ORAL_CAPSULE | ORAL | Status: DC

## 2013-10-24 NOTE — Assessment & Plan Note (Signed)
CBC

## 2013-10-24 NOTE — Assessment & Plan Note (Signed)
Trial of Gabapentin. 

## 2013-10-24 NOTE — Assessment & Plan Note (Signed)
Blood pressure goals reviewed. BMET 

## 2013-10-24 NOTE — Assessment & Plan Note (Signed)
Lipids, LFTs, TSH  

## 2013-10-24 NOTE — Progress Notes (Signed)
Subjective:    Patient ID: Blake Wilcox, male    DOB: [birth year], 73 y.o.   MRN: [id number]  HPI  He is here to assess status of active health conditions:  His last  labs were 12/14/12. Creatinine and liver function tests were normal. LDL was at minimal goal of less than 100 @ 88. HDL was protective at 69. Triglycerides 109. A1c 7.9%. His A1c goal would be less than 8%.  Diet/ nutrition: heart healthy diet Exercise program: no routine; retired but working part time  HYPERTENSION: Disease Monitoring:  Blood pressure range/ average : record not brought in Medication Compliance:off ACE-I ? reason  Diabetes :  FBS range/average: low 100's Highest 2 hr post meal glucose: 137 Medication compliance:off Metformin ? reason Hypoglycemia: no symptoms or documented hypoglycemia Ophthamology care:due now  Podiatry care:UTD  HYPERLIPIDEMIA: Disease Monitoring: last lipids 12/2012 Medication Compliance: no meds; heart healthy diet  Weight loss:? #; slowly regaining. Max weight 196 in 3/11. Lowest  Wt 166 in 6/15; now 170. Limb numbness/tingling/burning:no. He has had pain radiating from right hip to the right ankle intermittently over the last 2 months. There was no injury or trigger for this. There is no associated incontinence of urine or stool. Memory loss: yes, chronic; followed by neurology; on Aricept with subjective improvement in memory.Dose increased by Neurology.      Review of Systems  All below NEGATIVE: Chest pain, palpitations       Dyspnea Edema Claudication  Lightheadedness,Syncope Polyuria/phagia/dipsia    Blurred vision /diplopia/lossof vision Non healing skin lesions Abd pain, bowel changes (PMH of polyps)  Myalgias      Objective:   Physical Exam  Gen.: Healthy and well-nourished in appearance. Alert, appropriate and cooperative throughout exam. Appears younger than stated age  Head: Normocephalic without obvious abnormalities;  pattern alopecia    Eyes: No corneal or conjunctival inflammation noted. Pupils equal round reactive to light and accommodation. Arcus senilis Ears: External  ear exam reveals no significant lesions or deformities.  Hearing is grossly normal bilaterally. Nose: External nasal exam reveals no deformity or inflammation. Nasal mucosa are pink and moist. No lesions or exudates noted.   Mouth: Oral mucosa and oropharynx reveal no lesions or exudates. Teeth in fair-poor repair. Neck: No deformities, masses, or tenderness noted. Thyroid normal Lungs: Normal respiratory effort; chest expands symmetrically. Lungs are clear to auscultation without rales, wheezes, or increased work of breathing. Heart: Normal rate and rhythm. Normal S1 and S2. No gallop, click, or rub.Soft S4 w/o murmur. Abdomen: Bowel sounds normal; abdomen soft and nontender. No masses, organomegaly or hernias noted. Genitalia: as per Urology                               Musculoskeletal/extremities: No deformity or scoliosis noted of  the thoracic or lumbar spine.  No clubbing, cyanosis, edema, or significant extremity  deformity noted. Range of motion normal .Tone & strength normal. Hand joints normal. Interosseous wasting between L thumb & index finger. Fingernail  health good. Thickened & hyperpigmented great toe nails Able to lie down & sit up w/o help. Negative SLR bilaterally Vascular: Carotid, radial artery, dorsalis pedis and  posterior tibial pulses are equal. PTP slightly decreased. No bruits present. Neurologic: Alert but obvious memory deficit present. Deep tendon reflexes symmetrical and normal.  Gait normal  .  Skin: Intact without suspicious lesions or rashes. Lymph: No cervical, axillary lymphadenopathy present. Psych: Mood and  affect are normal. Normally interactive                                                                                        Assessment & Plan:  See Current Assessment & Plan in Problem List under specific  Diagnosis New: R lumbar radiculopathy trial of Gabapentin

## 2013-10-24 NOTE — Assessment & Plan Note (Signed)
A1c , urine microalbumin, BMET 

## 2013-10-24 NOTE — Progress Notes (Signed)
Pre visit review using our clinic review tool, if applicable. No additional management support is needed unless otherwise documented below in the visit note. 

## 2013-10-24 NOTE — Patient Instructions (Signed)
Minimal Blood Pressure Goal= AVERAGE < 140/90;  Ideal is an AVERAGE < 135/85. This AVERAGE should be calculated from @ least 5-7 BP readings taken @ different times of day on different days of week. You should not respond to isolated BP readings , but rather the AVERAGE for that week .Please bring your  blood pressure cuff to office visits to verify that it is reliable.It  can also be checked against the blood pressure device at the pharmacy. Finger or wrist cuffs are not dependable; an arm cuff is.  Your next office appointment will be determined based upon review of your pending labs. Those instructions will be transmitted to you through My Chart    Followup as needed for your acute issue. Please report any significant change in your symptoms. 

## 2013-10-25 ENCOUNTER — Other Ambulatory Visit: Payer: Self-pay | Admitting: Internal Medicine

## 2013-10-25 DIAGNOSIS — E1159 Type 2 diabetes mellitus with other circulatory complications: Secondary | ICD-10-CM

## 2013-10-25 DIAGNOSIS — D72819 Decreased white blood cell count, unspecified: Secondary | ICD-10-CM | POA: Insufficient documentation

## 2013-10-25 MED ORDER — INSULIN DETEMIR 100 UNIT/ML ~~LOC~~ SOLN: 18.0000 [IU] | Freq: Every day | SUBCUTANEOUS | Status: DC

## 2013-11-12 IMAGING — CT CT HEAD W/O CM
1 series · 16 of 30 positions shown, 20 images · non-contrast
Comparison: None.

CLINICAL DATA: 71-year-old male short-term memory loss, difficulty
remembering familiar information.  Mild dizziness.  Recently
diagnosed diabetes.

CT HEAD WITHOUT CONTRAST
TECHNIQUE: Contiguous axial images were obtained from the base of
the skull through the vertex without contrast.

[Series 2: head_seq -c 4.5 h37s st · axial · 0.47mm/px · z∈[-81,+49]mm · 16 of 32 slices shown, 20 images]
[im 2/32  brain]
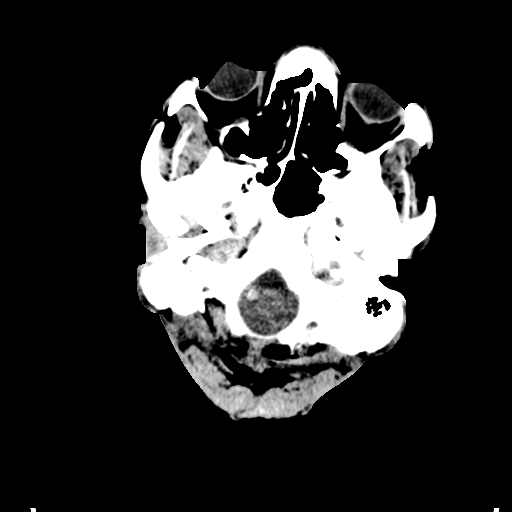
[im 2/32  bone]
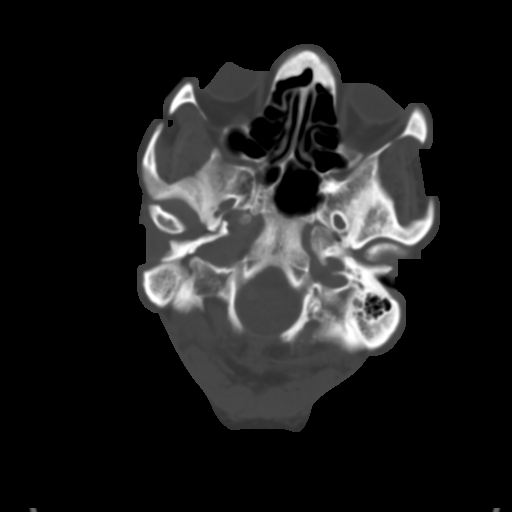
[im 4/32  brain]
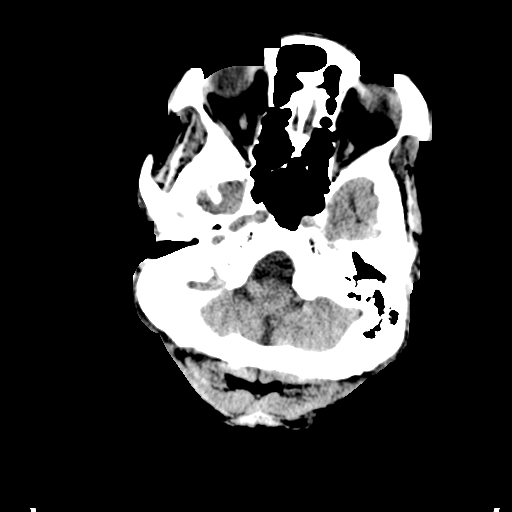
[im 6/32  brain]
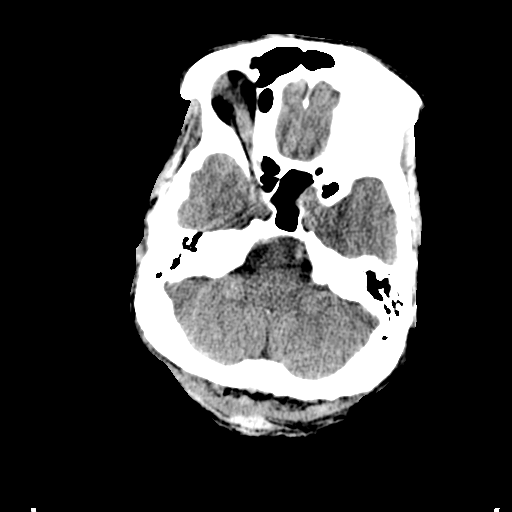
[im 8/32  brain]
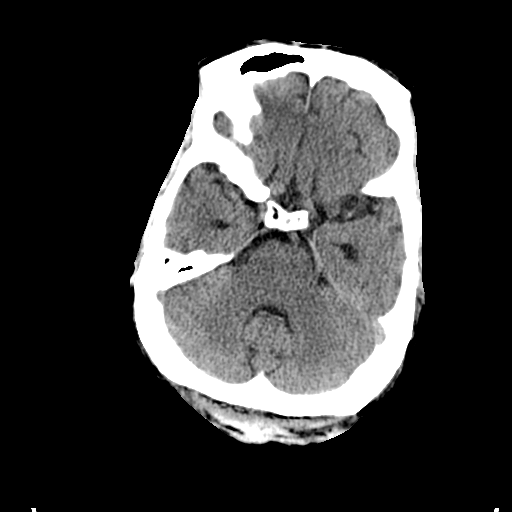
[im 9/32  brain]
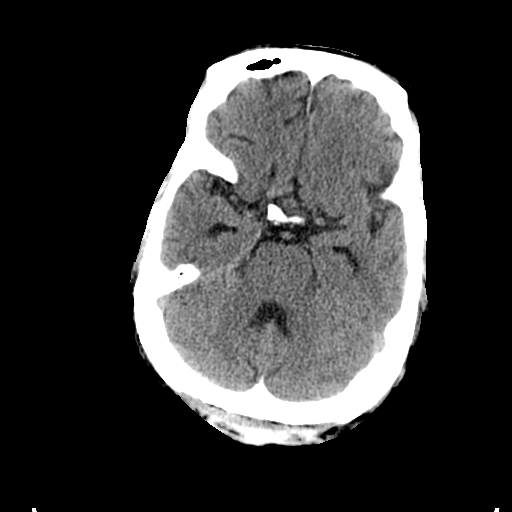
[im 9/32  bone]
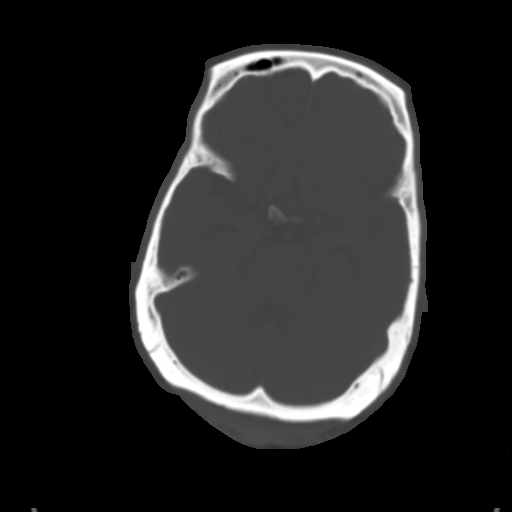
[im 11/32  brain]
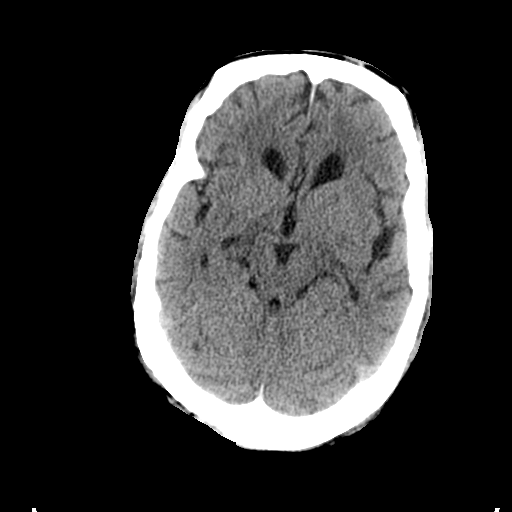
[im 13/32  brain]
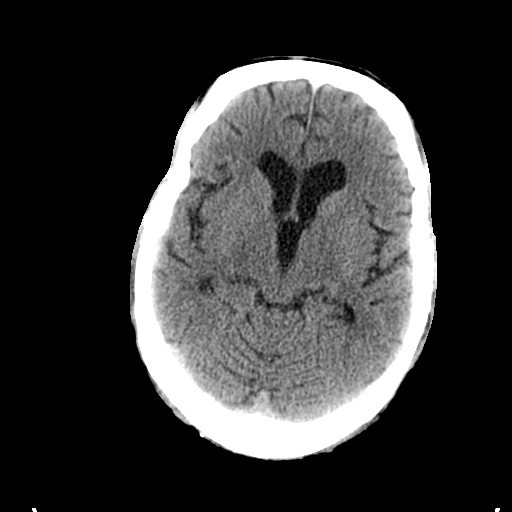
[im 15/32  brain]
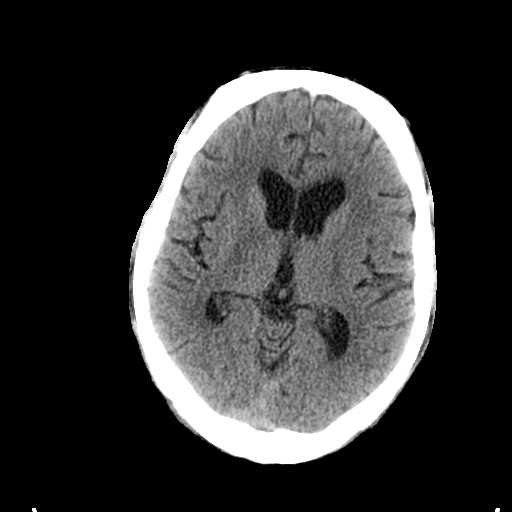
[im 17/32  brain]
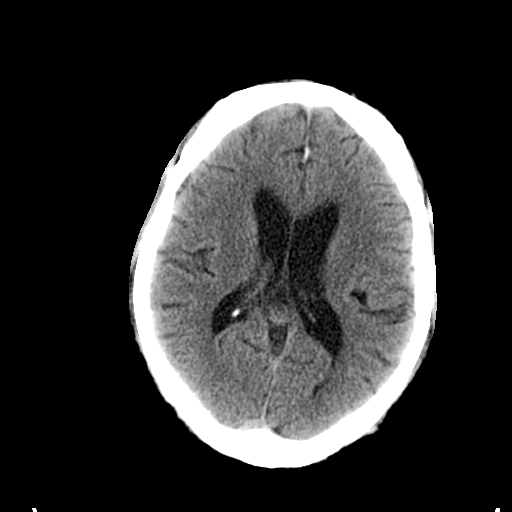
[im 17/32  bone]
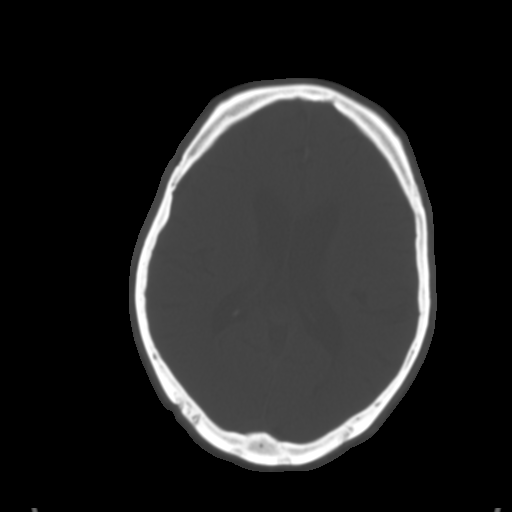
[im 19/32  brain]
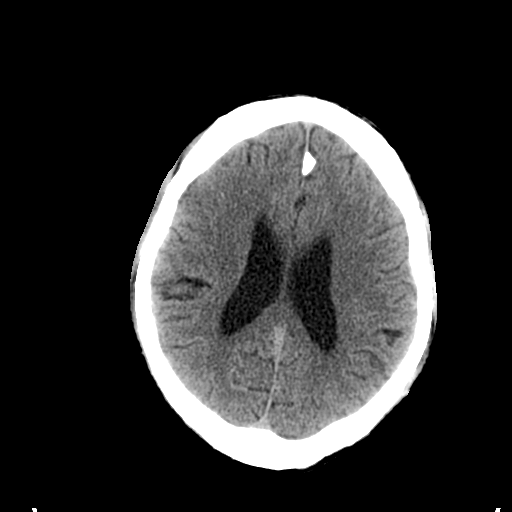
[im 21/32  brain]
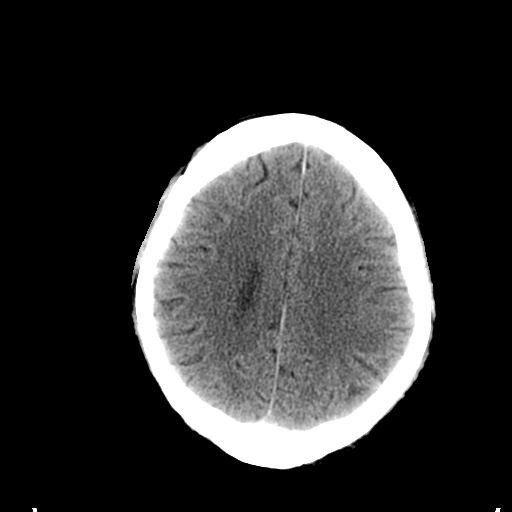
[im 23/32  brain]
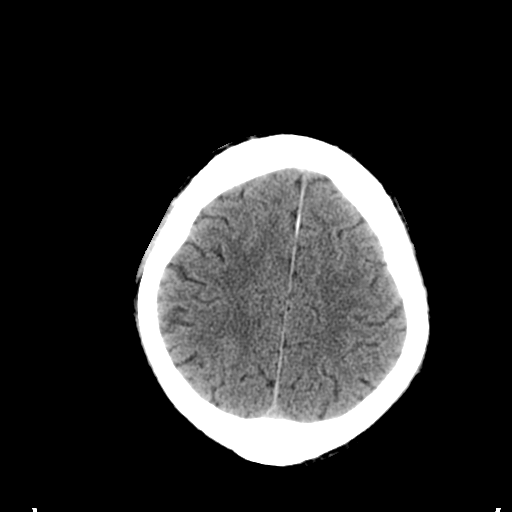
[im 24/32  brain]
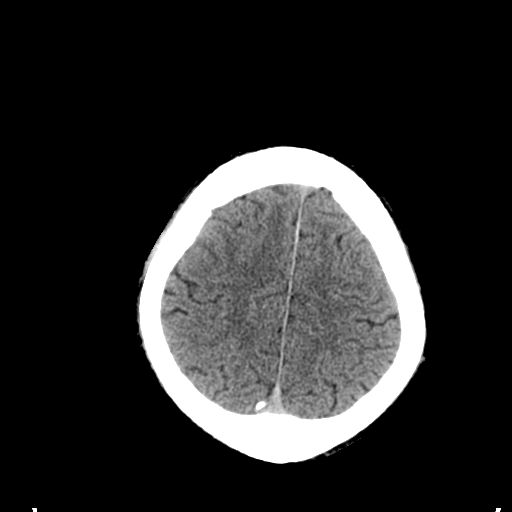
[im 24/32  bone]
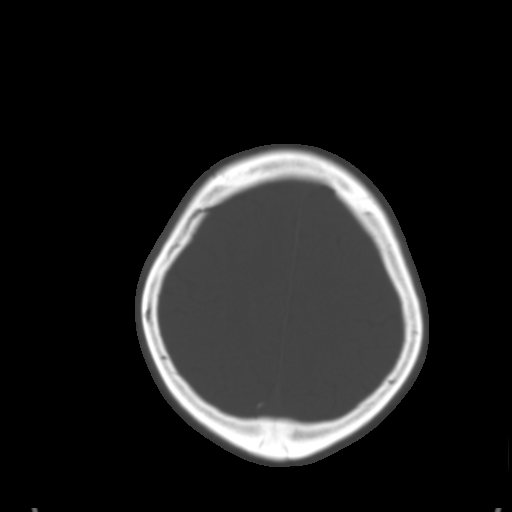
[im 26/32  brain]
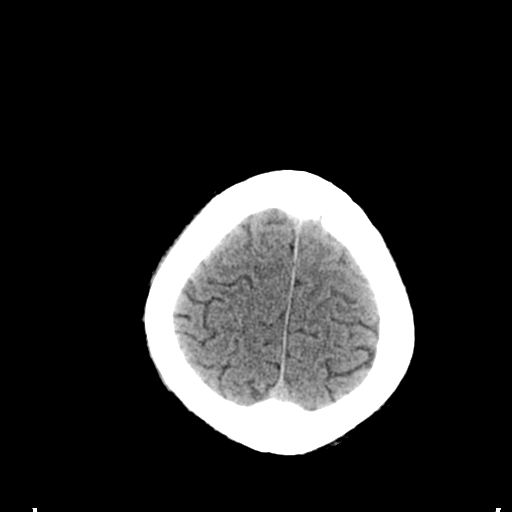
[im 28/32  brain]
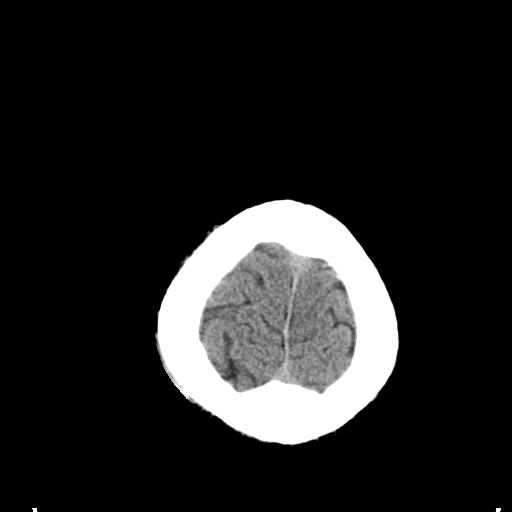
[im 30/32  brain]
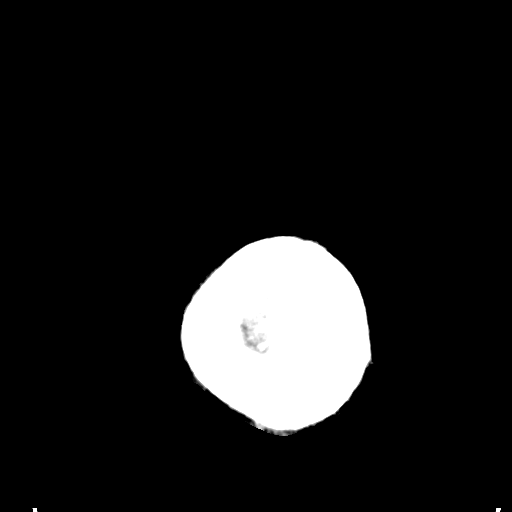

[16 of 30 positions shown; findings below may reference images not displayed]

FINDINGS: Visualized paranasal sinuses and mastoids are clear.
Postoperative changes to the left globe.  Other visualized orbit
soft tissues are within normal limits.  scalp soft tissues are
within normal limits except for evidence of mild scarring over the
right vertex.  No acute osseous abnormality identified.

Cerebral volume is within normal limits for age.   No
ventriculomegaly. No midline shift, mass effect, or evidence of
mass lesion.  No acute intracranial hemorrhage identified.  Gray-
white matter differentiation is within normal limits throughout the
brain.  No evidence of cortically based acute infarction
identified.  No suspicious intracranial vascular hyperdensity.
IMPRESSION: Normal noncontrast CT appearance of the brain for age.

## 2013-11-17 ENCOUNTER — Other Ambulatory Visit: Payer: Self-pay

## 2013-11-17 MED ORDER — GLUCOSE BLOOD VI STRP: ORAL_STRIP | Status: DC

## 2013-11-25 ENCOUNTER — Other Ambulatory Visit: Payer: Self-pay

## 2013-12-29 ENCOUNTER — Other Ambulatory Visit: Payer: Self-pay | Admitting: Internal Medicine

## 2014-01-30 ENCOUNTER — Encounter: Payer: Self-pay | Admitting: Nurse Practitioner

## 2014-01-30 ENCOUNTER — Ambulatory Visit (INDEPENDENT_AMBULATORY_CARE_PROVIDER_SITE_OTHER): Payer: Medicare PPO | Admitting: Nurse Practitioner

## 2014-01-30 VITALS — BP 113/64 | HR 60 | Temp 98.6°F | Resp 12 | Ht 74.0 in | Wt 170.6 lb

## 2014-01-30 DIAGNOSIS — R413 Other amnesia: Secondary | ICD-10-CM

## 2014-01-30 MED ORDER — DONEPEZIL HCL 10 MG PO TABS: 10.0000 mg | ORAL_TABLET | Freq: Every day | ORAL | Status: DC

## 2014-01-30 NOTE — Progress Notes (Signed)
[facility]  PATIENT: Blake Wilcox DOB: [birth year]   REASON FOR VISIT: Follow-up for memory loss  HISTORY OF PRESENT ILLNESS:Blake Wilcox, 73 year old male returns for followup.He has mild short-term memory problems. He was last seen in the office 08/02/2013 .He  stopped working about 15 months ago, he is currently living with his son who does the cooking and manages his medications. He feels like his memory is about the same. He states his diabetes is in better control. He denies any visual loss motor or sensory deficits. He has not fallen. There has been no wandering behavior. Sleeping well at night. Appetite is good, no side effects to Aricept. He needs refills . 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  In December 2013, he had excessive polyuria, he urinate every 15 minutes, went to Dr. Alwyn RenHopper, was found to have elevated glucose, A1c 15.1, he was started on diabetes medications, but since that time, he was also noticed to have mild short-term memory trouble, he could not remember where he puts things, but he is still able to drive, and go to work, he denies visual loss, no lateralized motor or sensory deficit,  CT head was normal. Normal TSH, B12.   REVIEW OF SYSTEMS: Full 14 system review of systems performed and notable only for those listed, all others are neg:  Constitutional: N/A  Cardiovascular: N/A  Ear/Nose/Throat: N/A  Skin: N/A  Eyes: N/A  Respiratory: N/A  Gastroitestinal: N/A  Hematology/Lymphatic: N/A  Endocrine: N/A Musculoskeletal:N/A  Allergy/Immunology: N/A  Neurological: N/A Psychiatric: N/A Sleep : NA   ALLERGIES: No Known Allergies  HOME  MEDICATIONS: Outpatient Prescriptions Prior to Visit  Medication Sig Dispense Refill  . donepezil (ARICEPT) 10 MG tablet Take 1 tablet (10 mg total) by mouth daily. 30 tablet 6  . dutasteride (AVODART) 0.5 MG capsule Take 0.5 mg by mouth daily.    Marland Kitchen. gabapentin (NEURONTIN) 100 MG capsule One pill every eight hours as needed for leg pain 30 capsule 2  . LEVEMIR FLEXTOUCH 100 UNIT/ML Pen INJECT 20 UNITS DAILY AT BEDTIME 15 mL 5  . glucose blood (ONE TOUCH ULTRA TEST) test strip CHECK BLOOD SUGAR TWICE DAILY DX:250.72 (Patient not taking: Reported on 01/30/2014) 100 each 3  . ONETOUCH DELICA LANCETS FINE MISC 1 Units by Does not apply route as directed. Check blood sugar daily as directed, DX:250.02 (Patient not taking: Reported on 01/30/2014) 100 each 3  . insulin detemir (LEVEMIR) 100 UNIT/ML injection Inject 0.18 mLs (18 Units total) into the skin at bedtime. (Patient taking differently: Inject 20 Units into the skin at bedtime. ) 3 mL 2  . metFORMIN (GLUCOPHAGE-XR) 500 MG 24 hr tablet TAKE 1 TABLET BY MOUTH TWICE  DAILY BEFORE MEALS 60 tablet 0  . ramipril (ALTACE) 2.5 MG capsule Take 1 capsule (2.5 mg total) by mouth daily. 30 capsule 5   No facility-administered medications prior to visit.    PAST MEDICAL HISTORY: Past Medical History  Diagnosis Date  . Diabetes mellitus     TYPE 2  . Elevated PSA   . FH: colonic polyps   . Anemia   . Hyperlipidemia   . Vegetarian   . Memory loss  PAST SURGICAL HISTORY: Past Surgical History  Procedure Laterality Date  . Hemorrhoid surgery    . Colonoscopy w/ polypectomy    . Cataract extraction    . Foreign body os      FAMILY HISTORY: Family History  Problem Relation Age of Onset  . Cancer Sister     BREAST  . Diabetes Maternal Aunt   . Stroke Neg Hx   . Hearing loss Neg Hx     SOCIAL HISTORY: History   Social History  . Marital Status: Married    Spouse Name: Blake Wilcox    Number of Children: 1  . Years of Education: 12    Occupational History  . SCHOOL SYSTEM; CUSTODIAL    Social History Main Topics  . Smoking status: Never Smoker   . Smokeless tobacco: Never Used  . Alcohol Use: No  . Drug Use: No  . Sexual Activity: Not on file   Other Topics Concern  . Not on file   Social History Narrative   PT.DOES EXERCISE   VEGETARIAN      Patient lives at home with his wife (Blake Wilcox). Patient is retired. Patient has 12th grade education.   Right handed.   Caffeine - one cup daily.   Patient has one child.     PHYSICAL EXAM  Filed Vitals:   01/30/14 0924  BP: 113/64  Pulse: 60  Temp: 98.6 F (37 C)  TempSrc: Oral  Resp: 12  Height: 6\' 2"  (1.88 m)  Weight: 170 lb 9.6 oz (77.384 kg)   Body mass index is 21.89 kg/(m^2). Generalized: Well developed, in no acute distress  Head: normocephalic and atraumatic,. Oropharynx benign  Neck: Supple, no carotid bruits  Cardiac: Regular rate rhythm, no murmur  Musculoskeletal: No deformity  Neurological examination  Mentation: Alert oriented to time, place, history taking. MMSE 23/30 missing items on orientation, calculation and 3 of 3 recall ,unable to copy a figure. Clock drawing 3/4. Follows all commands speech and language fluent  Cranial nerve II-XII: Pupils were equal round reactive to light extraocular movements were full, visual field were full on confrontational test. Facial sensation and strength were normal. hearing was intact to finger rubbing bilaterally. Uvula tongue midline. head turning and shoulder shrug were normal and symmetric.Tongue protrusion into cheek strength was normal.  Motor: normal bulk and tone, full strength in the BUE, BLE, fine finger movements normal, no pronator drift. No focal weakness  Coordination: finger-nose-finger, heel-to-shin bilaterally, no dysmetria  Reflexes: Brachioradialis 2/2, biceps 2/2, triceps 2/2, patellar 2/2, Achilles 1/1, plantar responses were flexor bilaterally.  Gait and Station: Rising  up from seated position without assistance, normal stance, moderate stride, good arm swing, smooth turning, able to perform tiptoe, and heel walking without difficulty. Tandem gait is steady. No assistive device   DIAGNOSTIC DATA (LABS, IMAGING, TESTING) - I reviewed patient records, labs, notes, testing and imaging myself where available.  Lab Results  Component Value Date   WBC 2.7* 10/24/2013   HGB 13.2 10/24/2013   HCT 38.6* 10/24/2013   MCV 93.6 10/24/2013   PLT 195.0 10/24/2013      Component Value Date/Time   NA 140 10/24/2013 0902   K 4.4 10/24/2013 0902   CL 104 10/24/2013 0902   CO2 28 10/24/2013 0902   GLUCOSE 119* 10/24/2013 0902   BUN 10 10/24/2013 0902   CREATININE 0.7 10/24/2013 0902   CALCIUM 9.4 10/24/2013 0902   PROT 7.3 10/24/2013 0902   ALBUMIN 3.8 10/24/2013 0902   AST  23 10/24/2013 0902   ALT 32 10/24/2013 0902   ALKPHOS 62 10/24/2013 0902   BILITOT 0.6 10/24/2013 0902   GFRNONAA 145.01 04/26/2009 0851   GFRAA 146 11/17/2007 0000   Lab Results  Component Value Date   CHOL 151 10/24/2013   HDL 59.50 10/24/2013   LDLCALC 76 10/24/2013   TRIG 77.0 10/24/2013   CHOLHDL 3 10/24/2013   Lab Results  Component Value Date   HGBA1C 6.6* 10/24/2013   Lab Results  Component Value Date   VITAMINB12 547 02/18/2012   Lab Results  Component Value Date   TSH 3.06 10/24/2013      ASSESSMENT AND PLAN  [age] y.o. year old male  has a past medical history of Diabetes mellitus;  Anemia; Hyperlipidemia; Vegetarian; and Memory loss. here to follow-up. Memory score is stable 23/30. He is currently on Aricept without side effects  Continue Aricept 10 mg daily will refill  memory score is stable Follow-up in 6 months next appointment with Dr. Carmelina NounYan Rodarius Kichline Carolyn Latysha Thackston, [facility]NP, [facility], APRN  [facility] [address], [address] [place], [place] [address] [phone]

## 2014-01-30 NOTE — Patient Instructions (Addendum)
Continue Aricept 10 mg daily will refill  memory score is stable Follow-up in 6 months next appointment with Dr. Terrace ArabiaYan

## 2014-02-08 NOTE — Progress Notes (Signed)
I agree above plan. 

## 2014-03-26 ENCOUNTER — Encounter: Payer: Self-pay | Admitting: Internal Medicine

## 2014-03-27 ENCOUNTER — Other Ambulatory Visit: Payer: Self-pay

## 2014-03-27 MED ORDER — INSULIN PEN NEEDLE 33G X 8 MM MISC: 1.0000 [IU] | Freq: Every day | Status: DC

## 2014-03-27 MED ORDER — INSULIN PEN NEEDLE 31G X 8 MM MISC: 1.0000 [IU] | Freq: Every day | Status: DC

## 2014-07-11 ENCOUNTER — Telehealth: Payer: Self-pay | Admitting: Internal Medicine

## 2014-07-11 MED ORDER — ONETOUCH ULTRA 2 W/DEVICE KIT: PACK | Status: DC

## 2014-07-11 MED ORDER — GLUCOSE BLOOD VI STRP: ORAL_STRIP | Status: DC

## 2014-07-11 MED ORDER — ONETOUCH DELICA LANCETS FINE MISC: 1.0000 [IU] | Status: DC

## 2014-07-11 NOTE — Telephone Encounter (Signed)
Sent rx for BS monitor w/supplies to [facility].Marland Kitchen..Blake Wilcox/lkmb

## 2014-07-11 NOTE — Telephone Encounter (Signed)
Patient's blood glucose monitor (ultra II) has broken. Pharmacy asking if we could send in a new RX for this.

## 2014-08-02 ENCOUNTER — Ambulatory Visit (INDEPENDENT_AMBULATORY_CARE_PROVIDER_SITE_OTHER): Payer: Medicare Other | Admitting: Neurology

## 2014-08-02 ENCOUNTER — Encounter: Payer: Self-pay | Admitting: Neurology

## 2014-08-02 VITALS — BP 116/65 | HR 63 | Ht 74.0 in | Wt 172.0 lb

## 2014-08-02 DIAGNOSIS — R413 Other amnesia: Secondary | ICD-10-CM

## 2014-08-02 MED ORDER — MEMANTINE HCL 10 MG PO TABS: 10.0000 mg | ORAL_TABLET | Freq: Two times a day (BID) | ORAL | Status: DC

## 2014-08-02 NOTE — Progress Notes (Signed)
Chief Complaint  Patient presents with  . Memory Loss    MMSE 17/30 - 11 animals.  He is here with his son, [name].  He feels donepezil is helpful with his memory.     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   In December 2013, he had excessive polyuria, he urinate every 15 minutes, went to Dr. Linna Darner, was found to have elevated glucose, A1c 15.1, he was started on diabetes medications, but since that time, he was also noticed to have mild short-term memory trouble, he could not remember where he puts things, but he is still able to drive, and go to work, he denies visual loss, no lateralized motor or sensory deficit  CT head was normal. Normal TSH, B12.   UPDATE June 22nd 2016: Last clinical visit was in December 2015 with [name], he is now taking Aricept 10 mg daily, which seems to help his memory.  He also use insulin, his son [name] inject him every night  He lives with his family, he still drives short distance, watches TV, MMSE 18 out of 30 today,,    REVIEW OF SYSTEMS: Full 14 system review of systems performed and notable only for those listed, all others are neg: memory loss, headaches   ALLERGIES: No Known Allergies  HOME MEDICATIONS: Outpatient Prescriptions Prior to Visit  Medication Sig Dispense Refill  . Blood Glucose Monitoring Suppl (ONE TOUCH ULTRA 2) W/DEVICE KIT Use to check blood sugars twice a day Dx E11.9 1 each 0  . donepezil (ARICEPT) 10 MG tablet Take 1 tablet (10 mg total) by mouth daily. 30 tablet 6  . dutasteride (AVODART) 0.5 MG capsule Take 0.5 mg by mouth daily.    Marland Kitchen gabapentin (NEURONTIN) 100 MG  capsule One pill every eight hours as needed for leg pain 30 capsule 2  . glucose blood (ONE TOUCH ULTRA TEST) test strip CHECK BLOOD SUGAR TWICE DAILY  DX E11.9 100 each 3  . Insulin Pen Needle 31G X 8 MM MISC 1 Units by Does not apply route daily. 100 each 3  . LEVEMIR FLEXTOUCH 100 UNIT/ML Pen INJECT 20 UNITS DAILY AT BEDTIME 15 mL 5  . ONETOUCH DELICA LANCETS FINE MISC 1 Units by Does not apply route as directed. Use to help check blood sugars twice a day Dx E11.9 100 each 3   No facility-administered medications prior to visit.    PAST MEDICAL HISTORY: Past Medical History  Diagnosis Date  . Diabetes mellitus     TYPE 2  . Elevated PSA   . FH: colonic polyps   . Anemia   . Hyperlipidemia   . Vegetarian   . Memory loss     PAST SURGICAL HISTORY: Past Surgical History  Procedure Laterality Date  . Hemorrhoid surgery    . Colonoscopy w/ polypectomy    . Cataract extraction    . Foreign body os      FAMILY HISTORY: Family History  Problem Relation Age of Onset  . Cancer Sister     BREAST  . Diabetes Maternal Aunt   . Stroke Neg Hx   . Hearing loss Neg Hx  SOCIAL HISTORY: History   Social History  . Marital Status: Married    Spouse Name: Delrose  . Number of Children: 1  . Years of Education: 12   Occupational History  . SCHOOL SYSTEM; CUSTODIAL    Social History Main Topics  . Smoking status: Never Smoker   . Smokeless tobacco: Never Used  . Alcohol Use: No  . Drug Use: No  . Sexual Activity: Not on file   Other Topics Concern  . Not on file   Social History Narrative   PT.DOES EXERCISE   VEGETARIAN      Patient lives at home with his wife (Delrose). Patient is retired. Patient has 12th grade education.   Right handed.   Caffeine - one cup daily.   Patient has one child.     PHYSICAL EXAM  Filed Vitals:   08/02/14 1022  BP: 116/65  Pulse: 63  Height: _0  (1.88 m)  Weight: 172 lb (78.019 kg)   Body mass index is 22.07  kg/(m^2). PHYSICAL EXAMNIATION:  Gen: NAD, conversant, well nourised, obese, well groomed                     Cardiovascular: Regular rate rhythm, no peripheral edema, warm, nontender. Eyes: Conjunctivae clear without exudates or hemorrhage Neck: Supple, no carotid bruise. Pulmonary: Clear to auscultation bilaterally   NEUROLOGICAL EXAM:  MENTAL STATUS: Speech:    Speech is normal; fluent and spontaneous with normal comprehension.  Cognition:   Mini-Mental Status Examination 18 out of 30,is not oriented to time, place, missed 3 out of 3 recalls  CRANIAL NERVES: CN II: Visual fields are full to confrontation. Pupil equal round reactive to light CN III, IV, VI: extraocular movement are normal. No ptosis. CN V: Facial sensation is intact to pinprick in all 3 divisions bilaterally. Corneal responses are intact.  CN VII: Face is symmetric with normal eye closure and smile. CN VIII: Hearing is normal to rubbing fingers CN IX, X: Palate elevates symmetrically. Phonation is normal. CN XI: Head turning and shoulder shrug are intact CN XII: Tongue is midline with normal movements and no atrophy.  MOTOR: There is no pronator drift of out-stretched arms. Muscle bulk and tone are normal. Muscle strength is normal.  REFLEXES: Reflexes are 2+ and symmetric at the biceps, triceps, knees, and ankles. Plantar responses are flexor.  SENSORY: Light touch, pinprick, position sense, and decreased vibration sense at toes  COORDINATION: Rapid alternating movements and fine finger movements are intact. There is no dysmetria on finger-to-nose and heel-knee-shin. There are no abnormal or extraneous movements.   GAIT/STANCE: Wide based, mildly unsteady    DIAGNOSTIC DATA (LABS, IMAGING, TESTING) - I reviewed patient records, labs, notes, testing and imaging myself where available.  Lab Results  Component Value Date   WBC 2.7* 10/24/2013   HGB 13.2 10/24/2013   HCT 38.6* 10/24/2013   MCV 93.6  10/24/2013   PLT 195.0 10/24/2013      Component Value Date/Time   NA 140 10/24/2013 0902   K 4.4 10/24/2013 0902   CL 104 10/24/2013 0902   CO2 28 10/24/2013 0902   GLUCOSE 119* 10/24/2013 0902   BUN 10 10/24/2013 0902   CREATININE 0.7 10/24/2013 0902   CALCIUM 9.4 10/24/2013 0902   PROT 7.3 10/24/2013 0902   ALBUMIN 3.8 10/24/2013 0902   AST 23 10/24/2013 0902   ALT 32 10/24/2013 0902   ALKPHOS 62 10/24/2013 0902   BILITOT 0.6 10/24/2013 0902  GFRNONAA 145.01 04/26/2009 0851   GFRAA 146 11/17/2007 0000   Lab Results  Component Value Date   CHOL 151 10/24/2013   HDL 59.50 10/24/2013   LDLCALC 76 10/24/2013   TRIG 77.0 10/24/2013   CHOLHDL 3 10/24/2013   Lab Results  Component Value Date   HGBA1C 6.6* 10/24/2013   Lab Results  Component Value Date   [id number] 008 02/18/2012   Lab Results  Component Value Date   TSH 3.06 10/24/2013    ASSESSMENT AND PLAN  74 y.o. year old male  has a past medical history, slowly progressive,  1, dementia,Continue Aricept 10 mg daily, add on Namenda 10 mg twice a day 2,  .Return to clinic in 6 months with Rhae Hammock, M.D. Ph.D.  [facility] [place], [place] [address] Phone: [phone] Fax:      [phone]

## 2014-08-07 ENCOUNTER — Other Ambulatory Visit: Payer: Self-pay

## 2014-09-13 HISTORY — PX: TRANSURETHRAL RESECTION OF PROSTATE: SHX73

## 2014-12-11 ENCOUNTER — Other Ambulatory Visit: Payer: Self-pay | Admitting: Nurse Practitioner

## 2014-12-28 ENCOUNTER — Telehealth: Payer: Self-pay | Admitting: Internal Medicine

## 2014-12-28 NOTE — Telephone Encounter (Signed)
Left vm to schedule AWV with Montine CircleSusan Hauck

## 2015-01-11 ENCOUNTER — Other Ambulatory Visit: Payer: Self-pay | Admitting: Internal Medicine

## 2015-01-16 ENCOUNTER — Telehealth: Payer: Self-pay

## 2015-01-16 NOTE — Telephone Encounter (Signed)
Reviewed cast this pm; It was noted that this patient has not seen Dr. Alwyn RenHopper since Sept of 2015;  Called the home and LVM for call back to speak to me directly; Hx of dementia and followed by neurology;  Will evaluate if the the patient / family calls back

## 2015-01-17 ENCOUNTER — Ambulatory Visit (INDEPENDENT_AMBULATORY_CARE_PROVIDER_SITE_OTHER): Payer: Medicare Other

## 2015-01-17 VITALS — BP 100/60 | HR 55 | Ht 73.0 in | Wt 179.0 lb

## 2015-01-17 DIAGNOSIS — Z23 Encounter for immunization: Secondary | ICD-10-CM | POA: Diagnosis not present

## 2015-01-17 DIAGNOSIS — Z0000 Encounter for general adult medical examination without abnormal findings: Secondary | ICD-10-CM

## 2015-01-17 NOTE — Progress Notes (Signed)
Subjective:   Blake Wilcox is a 74 y.o. male who presents for Medicare Annual/Subsequent preventive examination.  Review of Systems:   Cardiac Risk Factors include: advanced age (>59mn, >>31women);diabetes mellitus  HRA assessment completed during visit;  The Patient was informed that this wellness visit is to identify risk and educate on how to reduce risk for increase disease through lifestyle changes.   ROS deferred to CPE exam with physician and was scheduled when leaving   Blake Wilcox his bs every day; Normally checks in am with reading 125 or lower and pm 179 and 180.  Requested he check one pc meal; Will bring recording to CPE visit  Medical issues DM II: HTN; good as well/ A1c; (6.6)-10/2013    Hyperlipidemia; chol 151; Trig 77; HDL 59; LDL 76; [facility]  BMI: 23,6 Normal day; gets up to early; 7am - 8am;  10:30pm goes to bed; Sleeps at hs;  Relax until time to clean up the house;  Diet; eat breakfast; oatmeal; egg and toast; ginger Lunch; peanut butter sandwich Supper; brown rice ;fish; veg patty; does not eat a lot of meat;  Wife cooks; states he cooks some  Exercise; walk and rake leaves   SAFETY/ one level  Safety reviewed for the home; including removal of clutter; clear paths through the home, railing as needed; bathroom safety reviewed and has seat in tub; gets in the tub; does have separate shower;  community safety; smoke detectors  protection;  Driving accidents and seatbelt;  Sun protection; keep him out of the sun; keep lawnmower hid;  No accidents;   Medication review/reviwed; states he takes his own meds Son manages checking account; but the patient signs checks   Fall assessment /no  Gait assessment/ gets up and down  Mobilization and Functional losses in the last year. Independent with ADL;s; help with some I ADL's although still drives some;  Walks and rakes the leaves;  Sleep patterns sleep 8 hours     Urinary or fecal incontinence reviewed; TURP 09/13/2014   Counseling: Colonoscopy; not seen today EKG: 11/2007 Hearing: '2000hz'  both ears  Ophthalmology exam; Dr. SGershon Crane no gluacoma/ glasses for reading only / called Dr. SGershon Craneoffice for confirmation and to fax eye exam Immunizations Due  List of immunizations; Agreed to take flu (high dose) today; [facility]  Prevnar needed; educated would take that at CPE visit Confirming eye exam; Other labs to be drawn at CPE  Including A1c and microalbumin   Current Care Team reviewed and updated  Dr. RDelfino LovettPuschinsky for Prostate issues/ TURP 09/13/2014 added to history     Objective:    Vitals: BP 100/60 mmHg  Pulse 55  Ht '6\' 1"'  (1.854 m)  Wt 179 lb (81.194 kg)  BMI 23.62 kg/m2  SpO2 97%  Tobacco History  Smoking status  . Never Smoker   Smokeless tobacco  . Never Used     Counseling given: Yes   Past Medical History  Diagnosis Date  . Diabetes mellitus     TYPE 2  . Elevated PSA   . FH: colonic polyps   . Anemia   . Hyperlipidemia   . Vegetarian   . Memory loss    Past Surgical History  Procedure Laterality Date  . Hemorrhoid surgery    . Colonoscopy w/ polypectomy    . Cataract extraction    .  Foreign body os    . Transurethral resection of prostate N/A 09/13/2014    did not find cancer   Family History  Problem Relation Age of Onset  . Cancer Sister     BREAST  . Diabetes Maternal Aunt   . Stroke Neg Hx   . Hearing loss Neg Hx    History  Sexual Activity  . Sexual Activity: Not on file    Outpatient Encounter Prescriptions as of 01/17/2015  Medication Sig  . Blood Glucose Monitoring Suppl (ONE TOUCH ULTRA 2) W/DEVICE KIT Use to check blood sugars twice a day Dx E11.9  . donepezil (ARICEPT) 10 MG tablet TAKE 1 TABLET BY MOUTH DAILY  . glucose blood (ONE TOUCH ULTRA TEST) test strip CHECK BLOOD SUGAR TWICE  DAILY  DX E11.9  . Insulin Pen Needle 31G X 8 MM MISC 1 Units by Does not apply route daily.  Marland Kitchen LEVEMIR FLEXTOUCH 100 UNIT/ML Pen INJECT 20 UNITS EVERY NIGHT AT BEDTIME  . memantine (NAMENDA) 10 MG tablet Take 1 tablet (10 mg total) by mouth 2 (two) times daily.  Glory Rosebush DELICA LANCETS FINE MISC 1 Units by Does not apply route as directed. Use to help check blood sugars twice a day Dx E11.9  . dutasteride (AVODART) 0.5 MG capsule Take 0.5 mg by mouth daily.  Marland Kitchen gabapentin (NEURONTIN) 100 MG capsule One pill every eight hours as needed for leg pain (Patient not taking: Reported on 01/17/2015)   No facility-administered encounter medications on file as of 01/17/2015.    Activities of Daily Living In your present state of health, do you have any difficulty performing the following activities: 01/17/2015  Hearing? N  Vision? N  Difficulty concentrating or making decisions? (No Data)  Walking or climbing stairs? N  Dressing or bathing? N  Doing errands, shopping? Y  Preparing Food and eating ? N  Using the Toilet? N  In the past six months, have you accidently leaked urine? N  Do you have problems with loss of bowel control? N  Managing your Medications? N  Managing your Finances? Y  Housekeeping or managing your Housekeeping? N    Patient Care Team: Hendricks Limes, MD as PCP - General   Assessment:    Assessment   Patient presents for yearly preventative medicine examination. Medicare questionnaire screening were completed, i.e. Functional; fall risk; depression, memory loss and hearing.   Hearing '2000hz'  both ears  MMSE was 18/30; Recall 0; answered 20-3 x 1;  Clock test failed; Mr Wilcox is very social; reads cues; smiles and does answer questions; He still feels very independent; does still drive per the son; but close proximity to home. Enjoys staying busy; raking leaves; States he reads the "scriptures" every am;   Agreed to completed AD from [place] today; Named  son with him today, Blake Wilcox as his HCPOA; Asked x 2 if he wanted his wife to make decisions if he cannot make his own decisions and requested x 2 his son make decisions regarding his healthcare; Has strong belief in the life after death and made it clear that he did not want to remain here, but to let him go; he knows where he is going. Did not opt for life prolonging measures; did not opt for tube feeds under the conditions stipulated in the [place] AD form; Did opt for organ donation; did not opt to have son change his wishes if he cannot speak for himself; Explained how to void/revoke HCPOA and  explained to sign with 2 witnesses and notary.    All immunizations and health maintenance protocols were reviewed with the patient; Took his Flu shot today after [facility] that he had not had one. Son agreed;  Also will consider pneumonia vaccination when seeing Dr. Linna Darner prior to the end of the year.   Education provided for laboratory screens;  Lipids reviewed;   Medication reconciliation, past medical history, social history, problem list and allergies were reviewed in detail with the patient  Exercise Activities and Dietary recommendations   Goals were established to remain active;   Goals    None     Fall Risk Fall Risk  01/17/2015  Falls in the past year? No   Depression Screen PHQ 2/9 Scores 01/17/2015  PHQ - 2 Score 0    Cognitive Testing MMSE - Mini Mental State Exam 08/02/2014  Orientation to time 1  Orientation to Place 4  Registration 3  Attention/ Calculation 1  Recall 0  Language- name 2 objects 2  Language- repeat 1  Language- follow 3 step command 3  Language- read & follow direction 1  Write a sentence 1  Copy design 0  Total score 17   Score was actually 18/ 30 per documentation; copied incorrectly  Immunization History  Administered Date(s) Administered  . Influenza Split 11/18/2010  . Influenza Whole 01/11/2012  . Influenza, High Dose  Seasonal PF 01/17/2015  . Influenza-Unspecified 11/10/2012  . Td 02/10/1997, 11/17/2007   Screening Tests Health Maintenance  Topic Date Due  . COLONOSCOPY  02/07/1993  . ZOSTAVAX  02/08/2003  . PNA vac Low Risk Adult (1 of 2 - PCV13) 02/08/2008  . OPHTHALMOLOGY EXAM  09/10/2013  . FOOT EXAM  12/14/2013  . HEMOGLOBIN A1C  04/24/2014  . INFLUENZA VACCINE  09/11/2014  . URINE MICROALBUMIN  10/25/2014  . TETANUS/TDAP  11/16/2017      Plan:      Call to confirm eye exam this year at Dr. Celso Sickle;  LVM for call back  Had high does flu/ [facility] as well as no record of Pneumonia; will defer to CPE for prevnar  No colonoscopy record located  Shingles; deferred  Blood work to be drawn at [place]; Stated BS were in good control   During the course of the visit the patient was educated and counseled about the following appropriate screening and preventive services:   Vaccines to include Pneumoccal, Influenza, Hepatitis B, Td, Zostavax, HCV/To take pneumonia series/   Electrocardiogram/ 11/2007  Cardiovascular Disease/ DM;   Colorectal cancer screening/ report not located  Diabetes screening/ deferred to CPE  Prostate Cancer Screening/ Just had TURP 09/13/2014; tol well  Glaucoma screening/ confirming that this was completed  Nutrition counseling / adequate  Smoking cessation counseling/ n/a  Patient Instructions (the written plan) was given to the patient.    Wynetta Fines, RN  01/17/2015

## 2015-01-17 NOTE — Patient Instructions (Addendum)
[name] , Thank you for taking time to come for your Medicare Wellness Visit. I appreciate your ongoing commitment to your health goals. Please review the following plan we discussed and let me know if I can assist you in the future.   Will schedule apt with Dr. Alwyn Wilcox or other for CPE and labs;  Will take flu shot today (high does)   Goals are to continue to live life and maintain good health. These are the goals we discussed: Goals    None      This is a list of the screening recommended for you and due dates:  Health Maintenance  Topic Date Due  . Colon Cancer Screening  02/07/1993  . Shingles Vaccine  02/08/2003  . Pneumonia vaccines (1 of 2 - PCV13) 02/08/2008  . Eye exam for diabetics  09/10/2013  . Complete foot exam   12/14/2013  . Hemoglobin A1C  04/24/2014  . Flu Shot  09/11/2014  . Urine Protein Check  10/25/2014  . Tetanus Vaccine  11/16/2017      Fall Prevention in the Home  Falls can cause injuries. They can happen to people of all ages. There are many things you can do to make your home safe and to help prevent falls.  WHAT CAN I DO ON THE OUTSIDE OF MY HOME?  Regularly fix the edges of walkways and driveways and fix any cracks.  Remove anything that might make you trip as you walk through a door, such as a raised step or threshold.  Trim any bushes or trees on the path to your home.  Use bright outdoor lighting.  Clear any walking paths of anything that might make someone trip, such as rocks or tools.  Regularly check to see if handrails are loose or broken. Make sure that both sides of any steps have handrails.  Any raised decks and porches should have guardrails on the edges.  Have any leaves, snow, or ice cleared regularly.  Use sand or salt on walking paths during winter.  Clean up any spills in your garage right away. This includes oil or grease spills. WHAT CAN I DO IN THE BATHROOM?   Use night lights.  Install grab bars by the  toilet and in the tub and shower. Do not use towel bars as grab bars.  Use non-skid mats or decals in the tub or shower.  If you need to sit down in the shower, use a plastic, non-slip stool.  Keep the floor dry. Clean up any water that spills on the floor as soon as it happens.  Remove soap buildup in the tub or shower regularly.  Attach bath mats securely with double-sided non-slip rug tape.  Do not have throw rugs and other things on the floor that can make you trip. WHAT CAN I DO IN THE BEDROOM?  Use night lights.  Make sure that you have a light by your bed that is easy to reach.  Do not use any sheets or blankets that are too big for your bed. They should not hang down onto the floor.  Have a firm chair that has side arms. You can use this for support while you get dressed.  Do not have throw rugs and other things on the floor that can make you trip. WHAT CAN I DO IN THE KITCHEN?  Clean up any spills right away.  Avoid walking on wet floors.  Keep items that you use a lot in easy-to-reach places.  If  you need to reach something above you, use a strong step stool that has a grab bar.  Keep electrical cords out of the way.  Do not use floor polish or wax that makes floors slippery. If you must use wax, use non-skid floor wax.  Do not have throw rugs and other things on the floor that can make you trip. WHAT CAN I DO WITH MY STAIRS?  Do not leave any items on the stairs.  Make sure that there are handrails on both sides of the stairs and use them. Fix handrails that are broken or loose. Make sure that handrails are as long as the stairways.  Check any carpeting to make sure that it is firmly attached to the stairs. Fix any carpet that is loose or worn.  Avoid having throw rugs at the top or bottom of the stairs. If you do have throw rugs, attach them to the floor with carpet tape.  Make sure that you have a light switch at the top of the stairs and the bottom of  the stairs. If you do not have them, ask someone to add them for you. WHAT ELSE CAN I DO TO HELP PREVENT FALLS?  Wear shoes that:  Do not have high heels.  Have rubber bottoms.  Are comfortable and fit you well.  Are closed at the toe. Do not wear sandals.  If you use a stepladder:  Make sure that it is fully opened. Do not climb a closed stepladder.  Make sure that both sides of the stepladder are locked into place.  Ask someone to hold it for you, if possible.  Clearly mark and make sure that you can see:  Any grab bars or handrails.  First and last steps.  Where the edge of each step is.  Use tools that help you move around (mobility aids) if they are needed. These include:  Canes.  Walkers.  Scooters.  Crutches.  Turn on the lights when you go into a dark area. Replace any light bulbs as soon as they burn out.  Set up your furniture so you have a clear path. Avoid moving your furniture around.  If any of your floors are uneven, fix them.  If there are any pets around you, be aware of where they are.  Review your medicines with your doctor. Some medicines can make you feel dizzy. This can increase your chance of falling. Ask your doctor what other things that you can do to help prevent falls.   This information is not intended to replace advice given to you by your health care provider. Make sure you discuss any questions you have with your health care provider.   Document Released: 11/23/2008 Document Revised: 06/13/2014 Document Reviewed: 03/03/2014 Elsevier Interactive Patient Education 2016 [employer].  Health Maintenance, Male A healthy lifestyle and preventative care can promote health and wellness.  Maintain regular health, dental, and eye exams.  Eat a healthy diet. Foods like vegetables, fruits, whole grains, low-fat dairy products, and lean protein foods contain the nutrients you need and are low in calories. Decrease your intake of foods  high in solid fats, added sugars, and salt. Get information about a proper diet from your health care provider, if necessary.  Regular physical exercise is one of the most important things you can do for your health. Most adults should get at least 150 minutes of moderate-intensity exercise (any activity that increases your heart rate and causes you to sweat) each week. In  addition, most adults need muscle-strengthening exercises on 2 or more days a week.   Maintain a healthy weight. The body mass index (BMI) is a screening tool to identify possible weight problems. It provides an estimate of body fat based on height and weight. Your health care provider can find your BMI and can help you achieve or maintain a healthy weight. For males 20 years and older:  A BMI below 18.5 is considered underweight.  A BMI of 18.5 to 24.9 is normal.  A BMI of 25 to 29.9 is considered overweight.  A BMI of 30 and above is considered obese.  Maintain normal blood lipids and cholesterol by exercising and minimizing your intake of saturated fat. Eat a balanced diet with plenty of fruits and vegetables. Blood tests for lipids and cholesterol should begin at age 72 and be repeated every 5 years. If your lipid or cholesterol levels are high, you are over age 72, or you are at high risk for heart disease, you may need your cholesterol levels checked more frequently.Ongoing high lipid and cholesterol levels should be treated with medicines if diet and exercise are not working.  If you smoke, find out from your health care provider how to quit. If you do not use tobacco, do not start.  Lung cancer screening is recommended for adults aged 55-80 years who are at high risk for developing lung cancer because of a history of smoking. A yearly low-dose CT scan of the lungs is recommended for people who have at least a 30-pack-year history of smoking and are current smokers or have quit within the past 15 years. A pack year of  smoking is smoking an average of 1 pack of cigarettes a day for 1 year (for example, a 30-pack-year history of smoking could mean smoking 1 pack a day for 30 years or 2 packs a day for 15 years). Yearly screening should continue until the smoker has stopped smoking for at least 15 years. Yearly screening should be stopped for people who develop a health problem that would prevent them from having lung cancer treatment.  If you choose to drink alcohol, do not have more than 2 drinks per day. One drink is considered to be 12 oz (360 mL) of beer, 5 oz (150 mL) of wine, or 1.5 oz (45 mL) of liquor.  Avoid the use of street drugs. Do not share needles with anyone. Ask for help if you need support or instructions about stopping the use of drugs.  High blood pressure causes heart disease and increases the risk of stroke. High blood pressure is more likely to develop in:  People who have blood pressure in the end of the normal range (100-139/85-89 mm Hg).  People who are overweight or obese.  People who are African American.  If you are 5718-72 years of age, have your blood pressure checked every 3-5 years. If you are 72 years of age or older, have your blood pressure checked every year. You should have your blood pressure measured twice--once when you are at a hospital or clinic, and once when you are not at a hospital or clinic. Record the average of the two measurements. To check your blood pressure when you are not at a hospital or clinic, you can use:  An automated blood pressure machine at a pharmacy.  A home blood pressure monitor.  If you are 3245-72 years old, ask your health care provider if you should take aspirin to prevent heart disease.  Diabetes screening involves taking a blood sample to check your fasting blood sugar level. This should be done once every 3 years after age 96 if you are at a normal weight and without risk factors for diabetes. Testing should be considered at a younger age  or be carried out more frequently if you are overweight and have at least 1 risk factor for diabetes.  Colorectal cancer can be detected and often prevented. Most routine colorectal cancer screening begins at the age of 107 and continues through age 84. However, your health care provider may recommend screening at an earlier age if you have risk factors for colon cancer. On a yearly basis, your health care provider may provide home test kits to check for hidden blood in the stool. A small camera at the end of a tube may be used to directly examine the colon (sigmoidoscopy or colonoscopy) to detect the earliest forms of colorectal cancer. Talk to your health care provider about this at age 22 when routine screening begins. A direct exam of the colon should be repeated every 5-10 years through age 8, unless early forms of precancerous polyps or small growths are found.  People who are at an increased risk for hepatitis B should be screened for this virus. You are considered at high risk for hepatitis B if:  You were born in a country where hepatitis B occurs often. Talk with your health care provider about which countries are considered high risk.  Your parents were born in a high-risk country and you have not received a shot to protect against hepatitis B (hepatitis B vaccine).  You have HIV or AIDS.  You use needles to inject street drugs.  You live with, or have sex with, someone who has hepatitis B.  You are a man who has sex with other men (MSM).  You get hemodialysis treatment.  You take certain medicines for conditions like cancer, organ transplantation, and autoimmune conditions.  Hepatitis C blood testing is recommended for all people born from [birth year] through 1963 and any individual with known risk factors for hepatitis C.  Healthy men should no longer receive prostate-specific antigen (PSA) blood tests as part of routine cancer screening. Talk to your health care provider about prostate  cancer screening.  Testicular cancer screening is not recommended for adolescents or adult males who have no symptoms. Screening includes self-exam, a health care provider exam, and other screening tests. Consult with your health care provider about any symptoms you have or any concerns you have about testicular cancer.  Practice safe sex. Use condoms and avoid high-risk sexual practices to reduce the spread of sexually transmitted infections (STIs).  You should be screened for STIs, including gonorrhea and chlamydia if:  You are sexually active and are younger than 24 years.  You are older than 24 years, and your health care provider tells you that you are at risk for this type of infection.  Your sexual activity has changed since you were last screened, and you are at an increased risk for chlamydia or gonorrhea. Ask your health care provider if you are at risk.  If you are at risk of being infected with HIV, it is recommended that you take a prescription medicine daily to prevent HIV infection. This is called pre-exposure prophylaxis (PrEP). You are considered at risk if:  You are a man who has sex with other men (MSM).  You are a heterosexual man who is sexually active with multiple partners.  You take  drugs by injection.  You are sexually active with a partner who has HIV.  Talk with your health care provider about whether you are at high risk of being infected with HIV. If you choose to begin PrEP, you should first be tested for HIV. You should then be tested every 3 months for as long as you are taking PrEP.  Use sunscreen. Apply sunscreen liberally and repeatedly throughout the day. You should seek shade when your shadow is shorter than you. Protect yourself by wearing long sleeves, pants, a wide-brimmed hat, and sunglasses year round whenever you are outdoors.  Tell your health care provider of new moles or changes in moles, especially if there is a change in shape or color.  Also, tell your health care provider if a mole is larger than the size of a pencil eraser.  A one-time screening for abdominal aortic aneurysm (AAA) and surgical repair of large AAAs by ultrasound is recommended for men aged 65-75 years who are current or former smokers.  Stay current with your vaccines (immunizations).   This information is not intended to replace advice given to you by your health care provider. Make sure you discuss any questions you have with your health care provider.   Document Released: 07/26/2007 Document Revised: 02/17/2014 Document Reviewed: 06/24/2010 Elsevier Interactive Patient Education [employer]

## 2015-01-17 NOTE — Progress Notes (Signed)
Medical screening examination/treatment/procedure(s) were performed by non-physician practitioner and as supervising physician I was immediately available for consultation/collaboration. I agree with above. Juri Dinning, MD   

## 2015-01-19 NOTE — Telephone Encounter (Signed)
Call from Dr. Ashley RoyaltyShapiro's office and this patient's last eye exam was 2014;

## 2015-01-24 ENCOUNTER — Ambulatory Visit (INDEPENDENT_AMBULATORY_CARE_PROVIDER_SITE_OTHER): Payer: Medicare Other | Admitting: Nurse Practitioner

## 2015-01-24 ENCOUNTER — Encounter: Payer: Self-pay | Admitting: Nurse Practitioner

## 2015-01-24 VITALS — BP 106/65 | HR 59 | Ht 72.0 in | Wt 177.4 lb

## 2015-01-24 DIAGNOSIS — R413 Other amnesia: Secondary | ICD-10-CM | POA: Diagnosis not present

## 2015-01-24 DIAGNOSIS — G309 Alzheimer's disease, unspecified: Secondary | ICD-10-CM | POA: Diagnosis not present

## 2015-01-24 DIAGNOSIS — N138 Other obstructive and reflux uropathy: Secondary | ICD-10-CM | POA: Insufficient documentation

## 2015-01-24 DIAGNOSIS — F0280 Dementia in other diseases classified elsewhere without behavioral disturbance: Secondary | ICD-10-CM

## 2015-01-24 DIAGNOSIS — N401 Enlarged prostate with lower urinary tract symptoms: Secondary | ICD-10-CM

## 2015-01-24 NOTE — Progress Notes (Signed)
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  In December 2013, he had excessive polyuria, he urinate every 15 minutes, went to Dr. Linna Darner, was found to have elevated glucose, A1c 15.1, he was started on diabetes medications, but since that time, he was also noticed to have mild short-term memory trouble, he could not remember where he puts things, but he is still able to drive, and go to work, he denies visual loss, no lateralized motor or sensory deficit CT head was normal. Normal TSH, B12.   UPDATE June 22nd 2016: Last clinical visit was in December 2015 with [name], he is now taking Aricept 10 mg daily, which seems to help his memory. He also use insulin, his son Blake Wilcox inject him every night He lives with his family, he still drives short distance, watches TV, MMSE 18 out of 30 today,,  UPDATE 01/24/2015 [name], 74 year old male returns for follow-up with his son. He has a history of dementia and was last seen in this office by Dr. Dr. Krista Blue 08/02/2014. He is on Aricept 10 mg daily and Namenda was added at that time. He denies side effects to either of these drugs. He is not doing any exercises for his memory. He continues to live with family. He had prostate surgery back in August and apparently has had a full recovery. He returns for reevaluation   REVIEW OF SYSTEMS: Full 14 system review of systems performed and notable only for those listed, all others are neg:  Constitutional: neg  Cardiovascular: neg Ear/Nose/Throat: neg  Skin: neg Eyes: neg Respiratory: neg Gastroitestinal: neg    Hematology/Lymphatic: neg  Endocrine: neg Musculoskeletal:neg Allergy/Immunology: neg Neurological: Memory loss Psychiatric: neg Sleep : Snoring   ALLERGIES: No Known Allergies  HOME MEDICATIONS: Outpatient Prescriptions Prior to Visit  Medication Sig Dispense Refill  . Blood Glucose Monitoring Suppl (ONE TOUCH ULTRA 2) W/DEVICE KIT Use to check blood sugars twice a day Dx E11.9 1 each 0  . donepezil (ARICEPT) 10 MG tablet TAKE 1 TABLET BY MOUTH DAILY 30 tablet 3  . glucose blood (ONE TOUCH ULTRA TEST) test strip CHECK BLOOD SUGAR TWICE DAILY  DX E11.9 100 each 3  . Insulin Pen Needle 31G X 8 MM MISC 1 Units by Does not apply route daily. 100 each 3  . LEVEMIR FLEXTOUCH 100 UNIT/ML Pen INJECT 20 UNITS EVERY NIGHT AT BEDTIME 15 mL 0  . memantine (NAMENDA) 10 MG tablet Take 1 tablet (10 mg total) by mouth 2 (two) times daily. 60 tablet 11  . ONETOUCH DELICA LANCETS FINE MISC 1 Units by Does not apply route as directed. Use to help check blood sugars twice a day Dx E11.9 100 each 3  . dutasteride (AVODART) 0.5 MG capsule Take 0.5 mg by mouth daily. Reported on 01/24/2015    . gabapentin (NEURONTIN) 100 MG capsule One pill every eight hours as needed for leg pain (Patient not taking: Reported on 01/17/2015) 30 capsule 2   No facility-administered medications prior to visit.    PAST MEDICAL HISTORY: Past Medical History  Diagnosis Date  . Diabetes mellitus  TYPE 2  . Elevated PSA   . FH: colonic polyps   . Anemia   . Hyperlipidemia   . Vegetarian   . Memory loss     PAST SURGICAL HISTORY: Past Surgical History  Procedure Laterality Date  . Hemorrhoid surgery    . Colonoscopy w/ polypectomy    . Cataract extraction    . Foreign body os    . Transurethral resection of prostate N/A 09/13/2014    did not find cancer    FAMILY HISTORY: Family History  Problem Relation Age of Onset  . Cancer Sister     BREAST  . Diabetes Maternal Aunt   . Stroke Neg Hx   .  Hearing loss Neg Hx     SOCIAL HISTORY: Social History   Social History  . Marital Status: Married    Spouse Name: Blake Wilcox  . Number of Children: 1  . Years of Education: 12   Occupational History  . SCHOOL SYSTEM; CUSTODIAL    Social History Main Topics  . Smoking status: Never Smoker   . Smokeless tobacco: Never Used  . Alcohol Use: No  . Drug Use: No  . Sexual Activity: Not on file   Other Topics Concern  . Not on file   Social History Narrative   PT.DOES EXERCISE   VEGETARIAN      Patient lives at home with his wife (Blake Wilcox). Patient is retired. Patient has 12th grade education.   Right handed.   Caffeine - one cup daily.   Patient has one child.     PHYSICAL EXAM  Filed Vitals:   01/24/15 0824  BP: 106/65  Pulse: 59  Height: 6' (1.829 m)  Weight: 177 lb 6.4 oz (80.468 kg)   Body mass index is 24.05 kg/(m^2). Gen: NAD, conversant, well nourised, obese, well groomed  Cardiovascular: Regular rate rhythm,  Neck: Supple, no carotid bruit. Pulmonary: Clear to auscultation bilaterally   NEUROLOGICAL EXAM:  MENTAL STATUS: Speech:  Speech is normal; fluent and spontaneous with normal comprehension.  Cognition:  Mini-Mental Status Examination 19 out of 30,is not oriented to date year, day of the week missed 3 out of 3 recalls  CRANIAL NERVES: CN II: Visual fields are full to confrontation. Pupil equal round reactive to light CN III, IV, VI: extraocular movement are normal. No ptosis. CN V: Facial sensation is intact to pinprick in all 3 divisions bilaterally.   CN VII: Face is symmetric with normal eye closure and smile. CN VIII: Hearing is normal to rubbing fingers CN IX, X: Palate elevates symmetrically. Phonation is normal. CN XI: Head turning and shoulder shrug are intact CN XII: Tongue is midline with normal movements and no atrophy. MOTOR: There is no pronator drift of out-stretched arms. Muscle bulk and tone are normal.  Muscle strength is normal. REFLEXES: Reflexes are 2+ and symmetric at the biceps, triceps, knees, and ankles. Plantar responses are flexor. COORDINATION: Rapid alternating movements and fine finger movements are intact. There is no dysmetria on finger-to-nose and heel-knee-shin. There are no abnormal or extraneous movements.  GAIT/STANCE: Wide based, mildly unsteady, no difficulty with turns no assistive device  DIAGNOSTIC DATA (LABS, IMAGING, TESTING)   ASSESSMENT AND PLAN  75 y.o. year old male  has a past medical history of slowly progressive dementia.  Continue Namenda at current dose Continue Aricept at current dose Exercise your memory, given written information on safe environment in the home, tips to reduce confusion, using effective communication, reducing nighttime restlessness, good nutrition  and hydration, and reasons for seeking immediate medical care such as infection F/U in 6 months VST time 25 min Dennie Bible, [facility], [facility], APRN  [facility] [address], [place], [place] [address] [phone]

## 2015-01-24 NOTE — Patient Instructions (Signed)
Continue Namenda at current dose Continue Aricept at current dose Exercise your memory F/U in 6 months

## 2015-01-31 ENCOUNTER — Other Ambulatory Visit (INDEPENDENT_AMBULATORY_CARE_PROVIDER_SITE_OTHER): Payer: Medicare Other

## 2015-01-31 ENCOUNTER — Encounter: Payer: Self-pay | Admitting: Internal Medicine

## 2015-01-31 ENCOUNTER — Ambulatory Visit (INDEPENDENT_AMBULATORY_CARE_PROVIDER_SITE_OTHER): Payer: Medicare Other | Admitting: Internal Medicine

## 2015-01-31 ENCOUNTER — Other Ambulatory Visit: Payer: Self-pay | Admitting: Internal Medicine

## 2015-01-31 ENCOUNTER — Ambulatory Visit: Payer: Medicare Other | Admitting: Nurse Practitioner

## 2015-01-31 VITALS — BP 132/80 | HR 61 | Temp 98.4°F | Resp 20 | Ht 73.0 in | Wt 183.8 lb

## 2015-01-31 DIAGNOSIS — E1151 Type 2 diabetes mellitus with diabetic peripheral angiopathy without gangrene: Secondary | ICD-10-CM | POA: Diagnosis not present

## 2015-01-31 DIAGNOSIS — E1165 Type 2 diabetes mellitus with hyperglycemia: Secondary | ICD-10-CM | POA: Diagnosis not present

## 2015-01-31 DIAGNOSIS — D72819 Decreased white blood cell count, unspecified: Secondary | ICD-10-CM

## 2015-01-31 DIAGNOSIS — E785 Hyperlipidemia, unspecified: Secondary | ICD-10-CM

## 2015-01-31 DIAGNOSIS — D649 Anemia, unspecified: Secondary | ICD-10-CM

## 2015-01-31 DIAGNOSIS — Z23 Encounter for immunization: Secondary | ICD-10-CM | POA: Diagnosis not present

## 2015-01-31 DIAGNOSIS — R6889 Other general symptoms and signs: Secondary | ICD-10-CM | POA: Diagnosis not present

## 2015-01-31 DIAGNOSIS — IMO0002 Reserved for concepts with insufficient information to code with codable children: Secondary | ICD-10-CM

## 2015-01-31 LAB — CBC WITH DIFFERENTIAL/PLATELET
BASOS PCT: 0.5 % (ref 0.0–3.0)
Basophils Absolute: 0 10*3/uL (ref 0.0–0.1)
EOS ABS: 0.2 10*3/uL (ref 0.0–0.7)
EOS PCT: 5.8 % — AB (ref 0.0–5.0)
HEMATOCRIT: 34.5 % — AB (ref 39.0–52.0)
HEMOGLOBIN: 11.3 g/dL — AB (ref 13.0–17.0)
LYMPHS PCT: 45.5 % (ref 12.0–46.0)
Lymphs Abs: 1.7 10*3/uL (ref 0.7–4.0)
MCHC: 32.6 g/dL (ref 30.0–36.0)
MCV: 87.7 fl (ref 78.0–100.0)
MONOS PCT: 10.5 % (ref 3.0–12.0)
Monocytes Absolute: 0.4 10*3/uL (ref 0.1–1.0)
NEUTROS ABS: 1.4 10*3/uL (ref 1.4–7.7)
Neutrophils Relative %: 37.7 % — ABNORMAL LOW (ref 43.0–77.0)
PLATELETS: 181 10*3/uL (ref 150.0–400.0)
RBC: 3.93 Mil/uL — ABNORMAL LOW (ref 4.22–5.81)
RDW: 15 % (ref 11.5–15.5)
WBC: 3.8 10*3/uL — AB (ref 4.0–10.5)

## 2015-01-31 LAB — LIPID PANEL
CHOL/HDL RATIO: 3
Cholesterol: 184 mg/dL (ref 0–200)
HDL: 61.9 mg/dL (ref >39.00)
LDL Cholesterol: 99 mg/dL (ref 0–99)
NONHDL: 121.94
TRIGLYCERIDES: 117 mg/dL (ref 0.0–149.0)
VLDL: 23.4 mg/dL (ref 0.0–40.0)

## 2015-01-31 LAB — HEPATIC FUNCTION PANEL
ALBUMIN: 3.7 g/dL (ref 3.5–5.2)
ALT: 17 U/L (ref 0–53)
AST: 15 U/L (ref 0–37)
Alkaline Phosphatase: 73 U/L (ref 39–117)
BILIRUBIN DIRECT: 0.1 mg/dL (ref 0.0–0.3)
TOTAL PROTEIN: 7 g/dL (ref 6.0–8.3)
Total Bilirubin: 0.3 mg/dL (ref 0.2–1.2)

## 2015-01-31 LAB — BASIC METABOLIC PANEL
BUN: 15 mg/dL (ref 6–23)
CHLORIDE: 107 meq/L (ref 96–112)
CO2: 30 meq/L (ref 19–32)
Calcium: 9.8 mg/dL (ref 8.4–10.5)
Creatinine, Ser: 0.78 mg/dL (ref 0.40–1.50)
GFR: 125.84 mL/min (ref >60.00)
Glucose, Bld: 134 mg/dL — ABNORMAL HIGH (ref 70–99)
POTASSIUM: 4.6 meq/L (ref 3.5–5.1)
SODIUM: 142 meq/L (ref 135–145)

## 2015-01-31 LAB — MICROALBUMIN / CREATININE URINE RATIO
CREATININE, U: 183.2 mg/dL
MICROALB UR: 43.5 mg/dL — AB (ref 0.0–1.9)
MICROALB/CREAT RATIO: 23.7 mg/g (ref 0.0–30.0)

## 2015-01-31 LAB — HEMOGLOBIN A1C: Hgb A1c MFr Bld: 7.8 % — ABNORMAL HIGH (ref 4.6–6.5)

## 2015-01-31 LAB — TSH: TSH: 1.88 u[IU]/mL (ref 0.35–4.50)

## 2015-01-31 NOTE — Patient Instructions (Signed)
  Your next office appointment will be determined based upon review of your pending labs  and  xrays  Those written interpretation of the lab results and instructions will be transmitted to you by My Chart   Critical results will be called.   Followup as needed for any active or acute issue. Please report any significant change in your symptoms. 

## 2015-01-31 NOTE — Assessment & Plan Note (Signed)
A1c , urine microalbumin, BMET 

## 2015-01-31 NOTE — Assessment & Plan Note (Signed)
CBC & dif 

## 2015-01-31 NOTE — Progress Notes (Signed)
Pre visit review using our clinic review tool, if applicable. No additional management support is needed unless otherwise documented below in the visit note. 

## 2015-01-31 NOTE — Assessment & Plan Note (Signed)
Lipids, LFTs, TSH  

## 2015-01-31 NOTE — Progress Notes (Signed)
   Subjective:    Patient ID: Blake Wilcox, male    DOB: Nov 07, 1940, 74 y.o.   MRN: [id number]  HPI The patient is here to assess status of active health conditions.  PMH, FH, & Social History reviewed & updated.No change in FH as recorded.  He's been compliant with his medicines without adverse effects. He does not eat red meat. He is not exercising. He does not drink alcohol.  Ophthalmologic exam is not up-to-date. His son states his fasting blood sugars are 95-1 20 usually but can be up to 150. He's had no hypoglycemia. He has no associated diabetic issues of polydipsia, polyphagia, polyuria, numbness/tingling in extremities, nonhealing skin lesions, or postural hypotension.Marland Kitchen.  He previously had nocturia until he had prostate procedure in August by Dr. Merry LoftyPuchinsky.  By report colonoscopy is up-to-date. He has no active GI symptoms.  Leukopenia has not been followed up as recommened.  He is intolerant to cold.  Neurology followup of dementia is current.  Review of Systems  Chest pain, palpitations, tachycardia, exertional dyspnea, paroxysmal nocturnal dyspnea, claudication or edema are absent. No unexplained weight loss, abdominal pain, significant dyspepsia, dysphagia, melena, rectal bleeding, or persistently small caliber stools. Dysuria, pyuria, hematuria, frequency, or nocturia are denied. Change in hair, skin, nails denied. No bowel changes of constipation or diarrhea. No intolerance to heat .     Objective:   Physical Exam Pertinent or positive findings include: He is wearing 3 layers of clothing and two hats. Pattern alopecia is present. He has a beard and mustache. Dentition is fair. This posterior tibial pulses are slightly decreased. He has irregular hyperpigmented scarring over the shins. The great toenails are thickened and dark.  General appearance :adequately nourished; in no distress.  Eyes: No conjunctival inflammation or scleral icterus is present.  Oral  exam:  Lips and gums are healthy appearing.There is no oropharyngeal erythema or exudate noted.   Heart:  Normal rate and regular rhythm. S1 and S2 normal without gallop, murmur, click, rub or other extra sounds    Lungs:Chest clear to auscultation; no wheezes, rhonchi,rales ,or rubs presentNo increased work of breathing.   Abdomen: bowel sounds normal, soft and non-tender without masses, organomegaly or hernias noted.  No guarding or rebound..  Vascular : all pulses equal ; no bruits present.  Skin:Warm & dry.  Intact without suspicious lesions or rashes ; no tenting   Lymphatic: No lymphadenopathy is noted about the head, neck, axilla.   Neuro: Strength, tone & DTRs normal.      Assessment & Plan:  #1 See Current Assessment & Plan in Problem List under specific Diagnosis  #2 cold intolerance  See orders

## 2015-02-07 ENCOUNTER — Telehealth: Payer: Self-pay | Admitting: Internal Medicine

## 2015-02-07 NOTE — Telephone Encounter (Signed)
LVM advising patient awaiting c/b

## 2015-02-07 NOTE — Telephone Encounter (Signed)
Will you accept this patient? Also, is it okay for Pt to wait until June 2017 until next OV? He saw Dr. Alwyn RenHopper on 01/31/2015.

## 2015-02-07 NOTE — Telephone Encounter (Signed)
I prefer a earlier but not urgent appointment. What about  February? Please arrange

## 2015-02-07 NOTE — Telephone Encounter (Signed)
°  FYI-  Dr. Alwyn RenHopper is retiringand was referred to Dr. Drue NovelPaz, patient scheduled for 30 minute office visit for 08/08/2015.

## 2015-03-08 LAB — HM DIABETES EYE EXAM

## 2015-04-03 ENCOUNTER — Telehealth: Payer: Self-pay | Admitting: *Deleted

## 2015-04-03 NOTE — Telephone Encounter (Signed)
Unable to reach patient at time of pre-visit call. Left message for patient to return call when available.  

## 2015-04-04 ENCOUNTER — Ambulatory Visit (INDEPENDENT_AMBULATORY_CARE_PROVIDER_SITE_OTHER): Payer: Medicare Other | Admitting: Internal Medicine

## 2015-04-04 ENCOUNTER — Encounter: Payer: Self-pay | Admitting: Internal Medicine

## 2015-04-04 VITALS — BP 118/76 | HR 50 | Temp 97.8°F | Ht 73.0 in | Wt 186.4 lb

## 2015-04-04 DIAGNOSIS — N400 Enlarged prostate without lower urinary tract symptoms: Secondary | ICD-10-CM | POA: Diagnosis not present

## 2015-04-04 DIAGNOSIS — E119 Type 2 diabetes mellitus without complications: Secondary | ICD-10-CM

## 2015-04-04 DIAGNOSIS — E785 Hyperlipidemia, unspecified: Secondary | ICD-10-CM

## 2015-04-04 DIAGNOSIS — Z794 Long term (current) use of insulin: Secondary | ICD-10-CM

## 2015-04-04 DIAGNOSIS — G309 Alzheimer's disease, unspecified: Secondary | ICD-10-CM

## 2015-04-04 DIAGNOSIS — Z09 Encounter for follow-up examination after completed treatment for conditions other than malignant neoplasm: Secondary | ICD-10-CM

## 2015-04-04 DIAGNOSIS — F0280 Dementia in other diseases classified elsewhere without behavioral disturbance: Secondary | ICD-10-CM

## 2015-04-04 DIAGNOSIS — F0390 Unspecified dementia without behavioral disturbance: Secondary | ICD-10-CM | POA: Diagnosis not present

## 2015-04-04 MED ORDER — ZOSTER VACCINE LIVE 19400 UNT/0.65ML ~~LOC~~ SOLR: 0.6500 mL | Freq: Once | SUBCUTANEOUS | Status: DC

## 2015-04-04 MED ORDER — METFORMIN HCL 500 MG PO TABS: 500.0000 mg | ORAL_TABLET | Freq: Two times a day (BID) | ORAL | Status: DC

## 2015-04-04 MED ORDER — INSULIN DETEMIR 100 UNIT/ML FLEXPEN
20.0000 [IU] | PEN_INJECTOR | Freq: Every day | SUBCUTANEOUS | Status: DC
Start: 2015-04-04 — End: 2016-04-12

## 2015-04-04 NOTE — Patient Instructions (Signed)
Start metformin 500 mg: the first week take 1 tablet with breakfast, then take 1 tablet twice a day   Diabetes: Check your blood sugar  once a day   In the morning before breakfast   GOAL 100 to  130  Next visit 3 months , bring blood sugar records

## 2015-04-04 NOTE — Progress Notes (Signed)
Pre visit review using our clinic review tool, if applicable. No additional management support is needed unless otherwise documented below in the visit note. 

## 2015-04-04 NOTE — Progress Notes (Signed)
Subjective:    Patient ID: Blake Wilcox, male    DOB: [birth year], 75 y.o.   MRN: [id number]  DOS:  04/04/2015 Type of visit - description :  New patient to me, transferring from Dr. Linna Darner, here with [name] his son Interval history: In general feeling well. Dementia, note from neurology reviewed, on medications, memory is stable. Diabetes: Continue with Levemir,CBGs in the morning in the 130s. Dyslipidemia: On no medication, last FLP satisfactory BPH: Note from urology reviewed, status post TURP.   Review of Systems denies chest pain or difficulty breathing No nausea, vomiting, diarrhea. No dysuria, gross hematuria, difficulty urinating or incontinence. Past Medical History  Diagnosis Date  . Diabetes mellitus     TYPE 2  . Elevated PSA   . FH: colonic polyps   . Anemia   . Hyperlipidemia   . Vegetarian   . Memory loss     Past Surgical History  Procedure Laterality Date  . Hemorrhoid surgery    . Colonoscopy w/ polypectomy    . Cataract extraction    . Foreign body os    . Transurethral resection of prostate N/A 09/13/2014    did not find cancer    Social History   Social History  . Marital Status: Married    Spouse Name: Delrose  . Number of Children: 1  . Years of Education: 12   Occupational History  . SCHOOL SYSTEM; CUSTODIAL    Social History Main Topics  . Smoking status: Never Smoker   . Smokeless tobacco: Never Used  . Alcohol Use: No  . Drug Use: No  . Sexual Activity: Not on file   Other Topics Concern  . 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        Right handed.   Caffeine - one cup daily.   Patient has one child.        Medication List       This list is accurate as of: 04/04/15 11:59 PM.  Always use your most recent  med list.               donepezil 10 MG tablet  Commonly known as:  ARICEPT  TAKE 1 TABLET BY MOUTH DAILY     glucose blood test strip  Commonly known as:  ONE TOUCH ULTRA TEST  CHECK BLOOD SUGAR TWICE DAILY  DX E11.9     Insulin Detemir 100 UNIT/ML Pen  Commonly known as:  LEVEMIR FLEXTOUCH  Inject 20 Units into the skin daily at 10 pm.     Insulin Pen Needle 31G X 8 MM Misc  1 Units by Does not apply route daily.     memantine 10 MG tablet  Commonly known as:  NAMENDA  Take 1 tablet (10 mg total) by mouth 2 (two) times daily.     metFORMIN 500 MG tablet  Commonly known as:  GLUCOPHAGE  Take 1 tablet (500 mg total) by mouth 2 (two) times daily with a meal.     ONE TOUCH ULTRA 2 w/Device Kit  Use to check blood sugars twice a day Dx T70.0     ONETOUCH DELICA LANCETS FINE Misc  1 Units by Does not apply route as directed. Use to help check blood sugars  twice a day Dx E11.9     zoster vaccine live (PF) 19400 UNT/0.65ML injection  Commonly known as:  ZOSTAVAX  Inject 19,400 Units into the skin once.           Objective:   Physical Exam BP 118/76 mmHg  Pulse 50  Temp(Src) 97.8 F (36.6 C) (Oral)  Ht _0  (1.854 m)  Wt 186 lb 6 oz (84.539 kg)  BMI 24.59 kg/m2  SpO2 98% General:   Well developed, well nourished . NAD.  HEENT:  Normocephalic . Face symmetric, atraumatic Lungs:  CTA B Normal respiratory effort, no intercostal retractions, no accessory muscle use. Heart: RRR,  no murmur.  No pretibial edema bilaterally  Skin: Not pale. Not jaundice Neurologic:  alert & oriented to self, space, not to time,was not sure if kevin was his son or nephew Speech normal, gait appropriate for age and unassisted Psych--  Behavior appropriate. No anxious or depressed appearing.      Assessment & Plan:   Assessment DM Hyperlipidemia BPH TURP 2016 Dr Hazle Nordmann Progressive dementia, f/u neurology   son-- [name]   Plan: DM: last A1c above 7, continue Levemir  20 units, add metformin 500 units twice a day, previously took metformin without any side effects. watch for low blood sugars. Hyperlipidemia: On no medication, last LDL satisfactory BPH: Doing well after TURP Dementia: Seems at baseline, follow-up by neurology Primary care:Zostavax rx  provided. Wife dx w/ postherpetic neuralgia. RTC 3 months with CBG logs

## 2015-04-05 DIAGNOSIS — Z09 Encounter for follow-up examination after completed treatment for conditions other than malignant neoplasm: Secondary | ICD-10-CM | POA: Insufficient documentation

## 2015-04-05 NOTE — Assessment & Plan Note (Signed)
DM: last A1c above 7, continue Levemir 20 units, add metformin 500 units twice a day, previously took metformin without any side effects. watch for low blood sugars. Hyperlipidemia: On no medication, last LDL satisfactory BPH: Doing well after TURP Dementia: Seems at baseline, follow-up by neurology Primary care:Zostavax rx  provided. Wife dx w/ postherpetic neuralgia. RTC 3 months with CBG logs

## 2015-04-11 ENCOUNTER — Other Ambulatory Visit: Payer: Self-pay

## 2015-04-11 ENCOUNTER — Telehealth: Payer: Self-pay

## 2015-04-11 MED ORDER — GLUCOSE BLOOD VI STRP: ORAL_STRIP | Status: DC

## 2015-04-11 NOTE — Telephone Encounter (Signed)
Error

## 2015-04-30 ENCOUNTER — Other Ambulatory Visit: Payer: Self-pay | Admitting: Neurology

## 2015-05-26 ENCOUNTER — Other Ambulatory Visit: Payer: Self-pay | Admitting: Internal Medicine

## 2015-05-28 NOTE — Telephone Encounter (Signed)
Routing to patient's new pcp 

## 2015-07-04 ENCOUNTER — Ambulatory Visit (INDEPENDENT_AMBULATORY_CARE_PROVIDER_SITE_OTHER): Payer: Medicare Other | Admitting: Internal Medicine

## 2015-07-04 ENCOUNTER — Encounter: Payer: Self-pay | Admitting: Internal Medicine

## 2015-07-04 VITALS — BP 118/74 | HR 55 | Temp 98.9°F | Ht 73.0 in | Wt 177.5 lb

## 2015-07-04 DIAGNOSIS — D649 Anemia, unspecified: Secondary | ICD-10-CM | POA: Diagnosis not present

## 2015-07-04 DIAGNOSIS — E119 Type 2 diabetes mellitus without complications: Secondary | ICD-10-CM | POA: Diagnosis not present

## 2015-07-04 DIAGNOSIS — Z23 Encounter for immunization: Secondary | ICD-10-CM

## 2015-07-04 DIAGNOSIS — Z794 Long term (current) use of insulin: Secondary | ICD-10-CM | POA: Diagnosis not present

## 2015-07-04 DIAGNOSIS — F0280 Dementia in other diseases classified elsewhere without behavioral disturbance: Secondary | ICD-10-CM

## 2015-07-04 DIAGNOSIS — G309 Alzheimer's disease, unspecified: Secondary | ICD-10-CM | POA: Diagnosis not present

## 2015-07-04 LAB — CBC WITH DIFFERENTIAL/PLATELET
Basophils Absolute: 0 10*3/uL (ref 0.0–0.1)
Basophils Relative: 0.7 % (ref 0.0–3.0)
EOS ABS: 0.1 10*3/uL (ref 0.0–0.7)
Eosinophils Relative: 3.2 % (ref 0.0–5.0)
HCT: 36.5 % — ABNORMAL LOW (ref 39.0–52.0)
HEMOGLOBIN: 12.2 g/dL — AB (ref 13.0–17.0)
Lymphocytes Relative: 39.1 % (ref 12.0–46.0)
Lymphs Abs: 1.3 10*3/uL (ref 0.7–4.0)
MCHC: 33.5 g/dL (ref 30.0–36.0)
MCV: 91.8 fl (ref 78.0–100.0)
MONO ABS: 0.4 10*3/uL (ref 0.1–1.0)
Monocytes Relative: 11.6 % (ref 3.0–12.0)
Neutro Abs: 1.5 10*3/uL (ref 1.4–7.7)
Neutrophils Relative %: 45.4 % (ref 43.0–77.0)
Platelets: 187 10*3/uL (ref 150.0–400.0)
RBC: 3.97 Mil/uL — AB (ref 4.22–5.81)
RDW: 13.9 % (ref 11.5–15.5)
WBC: 3.3 10*3/uL — AB (ref 4.0–10.5)

## 2015-07-04 LAB — BASIC METABOLIC PANEL
BUN: 11 mg/dL (ref 6–23)
CALCIUM: 10 mg/dL (ref 8.4–10.5)
CO2: 32 mEq/L (ref 19–32)
CREATININE: 0.82 mg/dL (ref 0.40–1.50)
Chloride: 105 mEq/L (ref 96–112)
GFR: 118.64 mL/min (ref >60.00)
Glucose, Bld: 112 mg/dL — ABNORMAL HIGH (ref 70–99)
Potassium: 4 mEq/L (ref 3.5–5.1)
Sodium: 141 mEq/L (ref 135–145)

## 2015-07-04 LAB — HEMOGLOBIN A1C: HEMOGLOBIN A1C: 7.2 % — AB (ref 4.6–6.5)

## 2015-07-04 NOTE — Progress Notes (Signed)
Pre visit review using our clinic review tool, if applicable. No additional management support is needed unless otherwise documented below in the visit note. 

## 2015-07-04 NOTE — Progress Notes (Signed)
Subjective:    Patient ID: Blake Wilcox, male    DOB: Dec 08, 1940, 75 y.o.   MRN: [id number]  DOS:  07/04/2015 Type of visit - description : Routine checkup, here with his son Blake Wilcox Interval history: Since the last visit, he started metformin, ambulatory blood sugar check in the morning, usually in the 100s. Occasionally 200, never more than 230    Review of Systems No nausea, vomiting, diarrhea. No anxiety or depression Blake Wilcox reported memory is at baseline  Past Medical History  Diagnosis Date  . Diabetes mellitus     TYPE 2  . Elevated PSA   . FH: colonic polyps   . Anemia   . Hyperlipidemia   . Vegetarian   . Memory loss     Past Surgical History  Procedure Laterality Date  . Hemorrhoid surgery    . Colonoscopy w/ polypectomy    . Cataract extraction    . Foreign body os    . Transurethral resection of prostate N/A 09/13/2014    did not find cancer    Social History   Social History  . Marital Status: Married    Spouse Name: Delrose  . Number of Children: 1  . Years of Education: 12   Occupational History  . SCHOOL SYSTEM; CUSTODIAL    Social History Main Topics  . Smoking status: Never Smoker   . Smokeless tobacco: Never Used  . Alcohol Use: No  . Drug Use: No  . Sexual Activity: Not on file   Other Topics Concern  . 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        Right handed.   Caffeine - one cup daily.   Patient has one child.        Medication List       This list is accurate as of: 07/04/15 10:57 PM.  Always use your most recent med list.               aspirin 81 MG tablet  Take 81 mg by mouth daily.     donepezil 10 MG tablet  Commonly known as:  ARICEPT  TAKE 1 TABLET BY MOUTH DAILY     glucose blood test strip    Commonly known as:  ONE TOUCH ULTRA TEST  CHECK BLOOD SUGAR TWICE DAILY     Insulin Detemir 100 UNIT/ML Pen  Commonly known as:  LEVEMIR FLEXTOUCH  Inject 20 Units into the skin daily at 10 pm.     Insulin Pen Needle 31G X 8 MM Misc  Commonly known as:  B-D ULTRAFINE III SHORT PEN  Use daily as directed.     memantine 10 MG tablet  Commonly known as:  NAMENDA  Take 1 tablet (10 mg total) by mouth 2 (two) times daily.     metFORMIN 500 MG tablet  Commonly known as:  GLUCOPHAGE  Take 1 tablet (500 mg total) by mouth 2 (two) times daily with a meal.     ONE TOUCH ULTRA 2 w/Device Kit  Use to check blood sugars twice a day Dx I35.6     ONETOUCH DELICA LANCETS FINE Misc  1 Units by Does not apply route as directed. Use to help check blood sugars twice a day Dx E11.9  Objective:   Physical Exam BP 118/74 mmHg  Pulse 55  Temp(Src) 98.9 F (37.2 C) (Oral)  Ht _0  (1.854 m)  Wt 177 lb 8 oz (80.513 kg)  BMI 23.42 kg/m2  SpO2 98% General:   Well developed, well nourished . NAD.  HEENT:  Normocephalic . Face symmetric, atraumatic Lungs:  CTA B Normal respiratory effort, no intercostal retractions, no accessory muscle use. Heart: RRR,  no murmur.  No pretibial edema bilaterally  Skin: Not pale. Not jaundice Neurologic:  alert & oriented to self and space, not on time.Marland Kitchen  Speech normal, gait appropriate for age and unassisted Psych--  pleasently demented, cooperative. No anxious or depressed appearing.      Assessment & Plan:   Assessment DM Hyperlipidemia BPH TURP 2016 Dr Hazle Nordmann Progressive dementia, f/u [facility],neurology   son-- Blake Wilcox   Plan: DM: On Lantus and metformin, recheck A1c and BMP Dementia: Seems stable Mild anemia noted, check labs. Primary care: PNM 23 today, start aspirin 81 RTC 4-5  months.

## 2015-07-04 NOTE — Assessment & Plan Note (Signed)
DM: On Lantus and metformin, recheck A1c and BMP Dementia: Seems stable Mild anemia noted, check labs. Primary care: PNM 23 today, start aspirin 81 RTC 4-5  months.

## 2015-07-04 NOTE — Patient Instructions (Addendum)
GO TO THE LAB : Get the blood work     GO TO THE FRONT DESK Schedule your next appointment for a  routine checkup in 4 to 5 months   Diabetes: Check your blood sugar  once a day   at different times of the day  GOALS: Fasting before a meal 70- 130 2 hours after a meal less than 180 At bedtime 90-150 Call if consistently not at goal

## 2015-07-18 ENCOUNTER — Encounter: Payer: Self-pay | Admitting: Nurse Practitioner

## 2015-07-18 ENCOUNTER — Ambulatory Visit (INDEPENDENT_AMBULATORY_CARE_PROVIDER_SITE_OTHER): Payer: Medicare Other | Admitting: Nurse Practitioner

## 2015-07-18 VITALS — BP 102/67 | HR 64 | Ht 73.0 in | Wt 178.8 lb

## 2015-07-18 DIAGNOSIS — F0390 Unspecified dementia without behavioral disturbance: Secondary | ICD-10-CM | POA: Diagnosis not present

## 2015-07-18 MED ORDER — MEMANTINE HCL 10 MG PO TABS: 10.0000 mg | ORAL_TABLET | Freq: Two times a day (BID) | ORAL | Status: DC

## 2015-07-18 NOTE — Patient Instructions (Signed)
Continue Namenda at current dose Continue Aricept at current dose Follow up in 6 months next with Dr Terrace ArabiaYan

## 2015-07-18 NOTE — Progress Notes (Signed)
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  In December 2013, he had excessive polyuria, he urinate every 15 minutes, went to Dr. Linna Darner, was found to have elevated glucose, A1c 15.1, he was started on diabetes medications, but since that time, he was also noticed to have mild short-term memory trouble, he could not remember where he puts things, but he is still able to drive, and go to work, he denies visual loss, no lateralized motor or sensory deficit CT head was normal. Normal TSH, B12.   UPDATE June 22nd 2016: Last clinical visit was in December 2015 with [name], he is now taking Aricept 10 mg daily, which seems to help his memory. He also use insulin, his son [name] inject him every night He lives with his family, he still drives short distance, watches TV, MMSE 18 out of 30 today,,  UPDATE 01/24/2015 Blake Wilcox, 75 year old male returns for follow-up with his son. He has a history of dementia and was last seen in this office by Dr. Dr. Krista Blue 08/02/2014. He is on Aricept 10 mg daily and Namenda was added at that time. He denies side effects to either of these drugs. He is not doing any exercises for his memory. He continues to live with family. He had prostate surgery back in August and apparently has had a full recovery. He returns for reevaluation UPDATE 06/07/2017CM. Blake Wilcox 75 year old male returns for follow-up with his son and daughter. He has a history of progressive dementia and is currently on Aricept 10 mg daily and Namenda 10 mg twice daily. He denies any side effects to  these drugs. He continues to live with his son who works during the day however he has not identified any safety issues. Patient has good appetite and he sleeps well at night he is insulin-dependent diabetic with most recent hemoglobin A1c 7.2. He says he is continues to walk for exercise. He returns for reevaluation REVIEW OF SYSTEMS: Full 14 system review of systems performed and notable only for those listed, all others are neg:  Constitutional: neg  Cardiovascular: neg Ear/Nose/Throat: neg  Skin: neg Eyes: neg Respiratory: neg Gastroitestinal: neg  Hematology/Lymphatic: neg  Endocrine: neg Musculoskeletal:neg Allergy/Immunology: neg Neurological: Memory loss  Psychiatric: neg Sleep : Snoring   ALLERGIES: No Known Allergies  HOME MEDICATIONS: Outpatient Prescriptions Prior to Visit  Medication Sig Dispense Refill  . aspirin 81 MG tablet Take 81 mg by mouth daily.    . Blood Glucose Monitoring Suppl (ONE TOUCH ULTRA 2) W/DEVICE KIT Use to check blood sugars twice a day Dx E11.9 1 each 0  . donepezil (ARICEPT) 10 MG tablet TAKE 1 TABLET BY MOUTH DAILY 30 tablet 5  . glucose blood (ONE TOUCH ULTRA TEST) test strip CHECK BLOOD SUGAR TWICE DAILY 100 each 12  . Insulin Detemir (LEVEMIR FLEXTOUCH) 100 UNIT/ML Pen Inject 20 Units into the skin daily at 10 pm. 15 mL 6  . Insulin Pen Needle (B-D ULTRAFINE III SHORT PEN) 31G X 8 MM MISC Use daily as directed. 100 each 12  . memantine (NAMENDA) 10 MG tablet Take 1 tablet (  10 mg total) by mouth 2 (two) times daily. 60 tablet 11  . metFORMIN (GLUCOPHAGE) 500 MG tablet Take 1 tablet (500 mg total) by mouth 2 (two) times daily with a meal. 60 tablet 3  . ONETOUCH DELICA LANCETS FINE MISC 1 Units by Does not apply route as directed. Use to help check blood sugars twice a day Dx E11.9 100 each 3   No facility-administered medications prior to visit.    PAST MEDICAL HISTORY: Past Medical History  Diagnosis Date  . Diabetes mellitus      TYPE 2  . Elevated PSA   . FH: colonic polyps   . Anemia   . Hyperlipidemia   . Vegetarian   . Memory loss     PAST SURGICAL HISTORY: Past Surgical History  Procedure Laterality Date  . Hemorrhoid surgery    . Colonoscopy w/ polypectomy    . Cataract extraction    . Foreign body os    . Transurethral resection of prostate N/A 09/13/2014    did not find cancer    FAMILY HISTORY: Family History  Problem Relation Age of Onset  . Cancer Sister     BREAST  . Diabetes Maternal Aunt   . Stroke Neg Hx   . Hearing loss Neg Hx   . Colon cancer Neg Hx   . Prostate cancer Neg Hx     SOCIAL HISTORY: Social History   Social History  . Marital Status: Married    Spouse Name: Delrose  . Number of Children: 1  . Years of Education: 12   Occupational History  . SCHOOL SYSTEM; [employer]    Social History Main Topics  . Smoking status: Never Smoker   . Smokeless tobacco: Never Used  . Alcohol Use: No  . Drug Use: No  . Sexual Activity: Not on file   Other Topics Concern  . 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        Right handed.   Caffeine - one cup daily.   Patient has one child.     PHYSICAL EXAM  Filed Vitals:   07/18/15 0953  BP: 102/67  Pulse: 64  Height: '6\' 1"'  (1.854 m)  Weight: 178 lb 12.8 oz (81.103 kg)   Body mass index is 23.59 kg/(m^2). Gen: NAD, conversant, well nourised, well groomed  Cardiovascular: Regular rate rhythm,  Neck: Supple, no carotid bruit.  NEUROLOGICAL EXAM:  MENTAL STATUS: Speech:Speech is normal; fluent and spontaneous with normal comprehension.  Cognition:Mini-Mental Status Examination 16 out of 30,is not oriented to date year, day of the week missed 3 out of 3 recallsLast 19/30.   CRANIAL  NERVES: CN II: Visual fields are full to confrontation. Pupil equal round reactive to light CN III, IV, VI: extraocular movement are normal. No ptosis. CN V: Facial sensation is intact to pinprick in all 3 divisions bilaterally.  CN VII: Face is symmetric with normal eye closure and smile. CN VIII: Hearing is normal to rubbing fingers CN IX, X: Palate elevates symmetrically. Phonation is normal. CN XI: Head turning and shoulder shrug are intact CN XII: Tongue is midline with normal movements and no atrophy. MOTOR:There is no pronator drift of out-stretched arms. Muscle bulk and tone are normal. Muscle strength is normal. REFLEXES:Reflexes are  2+ and symmetric at the biceps, triceps, knees, and ankles. Plantar responses are flexor. COORDINATION:Rapid alternating movements and fine finger movements are intact. There is no dysmetria on finger-to-nose and heel-knee-shin. There are no abnormal or extraneous movements.  GAIT/STANCE:Wide based, mildly unsteady, no difficulty with turns no assistive device  DIAGNOSTIC DATA (LABS, IMAGING, TESTING) - I reviewed patient records, labs, notes, testing and imaging myself where available.  Lab Results  Component Value Date   WBC 3.3* 07/04/2015   HGB 12.2* 07/04/2015   HCT 36.5* 07/04/2015   MCV 91.8 07/04/2015   PLT 187.0 07/04/2015      Component Value Date/Time   NA 141 07/04/2015 1158   K 4.0 07/04/2015 1158   CL 105 07/04/2015 1158   CO2 32 07/04/2015 1158   GLUCOSE 112* 07/04/2015 1158   BUN 11 07/04/2015 1158   CREATININE 0.82 07/04/2015 1158   CALCIUM 10.0 07/04/2015 1158   PROT 7.0 01/31/2015 1422   ALBUMIN 3.7 01/31/2015 1422   AST 15 01/31/2015 1422   ALT 17 01/31/2015 1422   ALKPHOS 73 01/31/2015 1422   BILITOT 0.3 01/31/2015 1422   GFRNONAA 145.01 04/26/2009 0851   GFRAA 146 11/17/2007 0000   Lab Results  Component Value Date   CHOL 184 01/31/2015   HDL 61.90 01/31/2015   LDLCALC 99 01/31/2015   TRIG 117.0  01/31/2015   CHOLHDL 3 01/31/2015   Lab Results  Component Value Date   HGBA1C 7.2* 07/04/2015   Lab Results  Component Value Date   [id number] 539 02/18/2012   Lab Results  Component Value Date   TSH 1.88 01/31/2015      ASSESSMENT AND PLAN 75 y.o. year old male has a past medical history of slowly progressive dementia, poorly controlled diabetes, anemia here to follow-up..  Continue Namenda at current dose Continue Aricept at current dose NO safety issues identified Additional minutes answering questions for son and daughter regarding dementia treatment etc., and long term plans Follow-up in 6 months next visit with Dr. Krista Blue Vst time 29 min Dennie Bible, [facility], [facility], [facility] [address], [place], [place] [address] [phone]

## 2015-07-31 NOTE — Progress Notes (Signed)
I have reviewed and agreed above plan. 

## 2015-08-08 ENCOUNTER — Ambulatory Visit: Payer: Medicare Other | Admitting: Internal Medicine

## 2015-08-22 ENCOUNTER — Telehealth: Payer: Self-pay | Admitting: Internal Medicine

## 2015-08-22 NOTE — Telephone Encounter (Signed)
Caller name: Newman NipMonica Clayton  Relationship to patient: [facility] Can be reached:[phone]  Reason for call[facility] would like to give report to PCP. Plse call back

## 2015-08-23 NOTE — Telephone Encounter (Signed)
Spoke w/ Maxine GlennMonica, NP with [facility]. She wanted to inform PCP that she visited w/ Pt on 08/21/2015 in his home and urine showed 2+ protein, everything else normal. A1c was 6.2. She stated no F/U w/ her is needed but just wanted to make Dr. Drue NovelPaz aware. Informed her I would forward result to Dr. Drue NovelPaz.

## 2015-08-23 NOTE — Telephone Encounter (Signed)
Returned Call  Received: Today    Blake Wilcox  [name], CMA    Phone Number: [phone]            [facility]ica with [facility] will be waiting for your call

## 2015-08-23 NOTE — Telephone Encounter (Signed)
Noted, will discuss that when he comes back for his checkup

## 2015-08-23 NOTE — Telephone Encounter (Signed)
LMOM informing Monica, NP to return call at her convenience to discuss, or if she would like to fax over her report she could at [phone].

## 2015-09-18 ENCOUNTER — Other Ambulatory Visit: Payer: Self-pay | Admitting: Internal Medicine

## 2015-10-24 ENCOUNTER — Other Ambulatory Visit: Payer: Self-pay | Admitting: Neurology

## 2015-10-28 ENCOUNTER — Other Ambulatory Visit: Payer: Self-pay | Admitting: Internal Medicine

## 2015-12-05 ENCOUNTER — Ambulatory Visit (INDEPENDENT_AMBULATORY_CARE_PROVIDER_SITE_OTHER): Payer: Medicare Other | Admitting: Internal Medicine

## 2015-12-05 ENCOUNTER — Encounter: Payer: Self-pay | Admitting: Internal Medicine

## 2015-12-05 VITALS — BP 118/68 | HR 60 | Temp 98.1°F | Resp 14 | Ht 73.0 in | Wt 177.5 lb

## 2015-12-05 DIAGNOSIS — Z794 Long term (current) use of insulin: Secondary | ICD-10-CM

## 2015-12-05 DIAGNOSIS — Z23 Encounter for immunization: Secondary | ICD-10-CM | POA: Diagnosis not present

## 2015-12-05 DIAGNOSIS — Z0000 Encounter for general adult medical examination without abnormal findings: Secondary | ICD-10-CM

## 2015-12-05 DIAGNOSIS — E1169 Type 2 diabetes mellitus with other specified complication: Secondary | ICD-10-CM | POA: Diagnosis not present

## 2015-12-05 LAB — HEMOGLOBIN A1C: Hgb A1c MFr Bld: 5.9 % (ref 4.6–6.5)

## 2015-12-05 NOTE — Assessment & Plan Note (Signed)
Not doing a  physical exam but chart is reviewed: Td 1999 Zostavax 2-17  ;  pnm 23-- 08-2015;  pnm 13--12-16 Flu shot today  No history of previous colonoscopy, d/w pt's son --->  we agreed not to proceed with [facility]. Sees urology regularly.

## 2015-12-05 NOTE — Patient Instructions (Signed)
GO TO THE LAB : Get the blood work     GO TO THE FRONT DESK Schedule your next appointment for a routine checkup in 4-6 months. Call  if questions or problems.  Check your blood sugars once or twice a day before or after meals GOALS: Fasting before a meal  80 to  130 2 hours after a meal less than 180 At bedtime 90-150 Call if consistently not at goal   Watch for low blood sugar symptoms

## 2015-12-05 NOTE — Progress Notes (Signed)
Pre visit review using our clinic review tool, if applicable. No additional management support is needed unless otherwise documented below in the visit note. 

## 2015-12-05 NOTE — Assessment & Plan Note (Signed)
DM: Seems to be doing great, no evidence of low blood sugars. We'll continue Levemir 20 units, metformin, monitoring CBGs, watch for hypoglycemia. See instructions. Check A1c. RTC 4-6 months

## 2015-12-05 NOTE — Progress Notes (Signed)
Subjective:    Patient ID: Blake Wilcox, male    DOB: March 26, 1940, 75 y.o.   MRN: [id number]  DOS:  12/05/2015 Type of visit - description : rov, Here with his son Blake Wilcox Interval history: Good medication compliance, no apparent side effects. Ambulatory BPs are usually check in the morning and range from 82-110. The patient does not display any symptoms of low sugar such as diaphoresis, increased confusion from baseline.   Review of Systems Denies fever chills. No cough No nausea or vomiting. Appetite is normal.  Past Medical History:  Diagnosis Date  . Anemia   . Diabetes mellitus    TYPE 2  . Elevated PSA   . FH: colonic polyps   . Hyperlipidemia   . Memory loss   . Vegetarian     Past Surgical History:  Procedure Laterality Date  . CATARACT EXTRACTION    . COLONOSCOPY W/ POLYPECTOMY    . FOREIGN BODY OS    . HEMORRHOID SURGERY    . TRANSURETHRAL RESECTION OF PROSTATE N/A 09/13/2014   did not find cancer    Social History   Social History  . Marital status: Married    Spouse name: Blake Wilcox  . Number of children: 1  . Years of education: 84   Occupational History  . SCHOOL SYSTEM; [employer] History Main Topics  . Smoking status: Never Smoker  . Smokeless tobacco: Never Used  . Alcohol use No  . Drug use: No  . Sexual activity: Not on file   Other Topics Concern  . 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        Right handed.   Caffeine - one cup daily.   Patient has one child.        Medication List       Accurate as of 12/05/15  9:45 AM. Always use your most recent med list.          aspirin 81 MG tablet Take 81 mg by mouth daily.   donepezil 10 MG tablet Commonly known as:  ARICEPT TAKE 1 TABLET  BY MOUTH DAILY   glucose blood test strip Commonly known as:  ONE TOUCH ULTRA TEST CHECK BLOOD SUGAR TWICE DAILY   Insulin Detemir 100 UNIT/ML Pen Commonly known as:  LEVEMIR FLEXTOUCH Inject 20 Units into the skin daily at 10 pm.   Insulin Pen Needle 31G X 8 MM Misc Commonly known as:  B-D ULTRAFINE III SHORT PEN Use daily as directed.   memantine 10 MG tablet Commonly known as:  NAMENDA Take 1 tablet (10 mg total) by mouth 2 (two) times daily.   metFORMIN 500 MG tablet Commonly known as:  GLUCOPHAGE Take 1 tablet (500 mg total) by mouth 2 (two) times daily with a meal.   ONE TOUCH ULTRA 2 w/Device Kit Use to check blood sugars twice a day Dx D74.1   ONETOUCH DELICA LANCETS FINE Misc 1 Units by Does not apply route as directed. Use to help check blood sugars twice a day Dx E11.9          Objective:   Physical Exam BP 118/68 (BP Location: Right Arm, Patient Position: Sitting, Cuff Size: Small)   Pulse 60   Temp  98.1 F (36.7 C) (Oral)   Resp 14   Ht '6\' 1"'  (1.854 m)   Wt 177 lb 8 oz (80.5 kg)   BMI 23.42 kg/m  General:   Well developed, well nourished . NAD.  HEENT:  Normocephalic . Face symmetric, atraumatic Lungs:  CTA B Normal respiratory effort, no intercostal retractions, no accessory muscle use. Heart: RRR,  no murmur.  No pretibial edema bilaterally  Skin: Not pale. Not jaundice Neurologic:  alert & cooperative. pleasantly  demented Psych--  No anxious or depressed appearing.      Assessment & Plan:  Assessment DM Hyperlipidemia BPH TURP 2016 Dr Hazle Nordmann Progressive dementia, f/u [facility],neurology   son-- Blake Wilcox   PLAN DM: Seems to be doing great, no evidence of low blood sugars. We'll continue Levemir 20 units, metformin, monitoring CBGs, watch for hypoglycemia. See instructions. Check A1c. RTC 4-6 months

## 2016-01-16 ENCOUNTER — Ambulatory Visit (INDEPENDENT_AMBULATORY_CARE_PROVIDER_SITE_OTHER): Payer: Medicare Other | Admitting: Neurology

## 2016-01-16 ENCOUNTER — Encounter: Payer: Self-pay | Admitting: Neurology

## 2016-01-16 VITALS — BP 122/75 | HR 69 | Ht 73.0 in | Wt 176.0 lb

## 2016-01-16 DIAGNOSIS — G301 Alzheimer's disease with late onset: Secondary | ICD-10-CM

## 2016-01-16 DIAGNOSIS — F0280 Dementia in other diseases classified elsewhere without behavioral disturbance: Secondary | ICD-10-CM | POA: Diagnosis not present

## 2016-01-16 DIAGNOSIS — G309 Alzheimer's disease, unspecified: Secondary | ICD-10-CM | POA: Diagnosis not present

## 2016-01-16 MED ORDER — DONEPEZIL HCL 10 MG PO TABS: 10.0000 mg | ORAL_TABLET | Freq: Every day | ORAL | 4 refills | Status: DC

## 2016-01-16 MED ORDER — MEMANTINE HCL 10 MG PO TABS: 10.0000 mg | ORAL_TABLET | Freq: Two times a day (BID) | ORAL | 4 refills | Status: DC

## 2016-01-16 NOTE — Progress Notes (Signed)
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  In December 2013, he had excessive polyuria, he urinate every 15 minutes, went to Dr. Linna Darner, was found to have elevated glucose, A1c 15.1, he was started on diabetes medications, but since that time, he was also noticed to have mild short-term memory trouble, he could not remember where he puts things, but he is still able to drive, and go to work, he denies visual loss, no lateralized motor or sensory deficit CT head was normal. Normal TSH, B12.   UPDATE June 22nd 2016: Last clinical visit was in December 2015 with [name], he is now taking Aricept 10 mg daily, which seems to help his memory. He also use insulin, his son [name] inject him every night He lives with his family, he still drives short distance, watches TV, MMSE 18 out of 55 today   UPDATE Dec 6th 2017: He is with his son and daughter, he lives with his wife and his son, he stays home, cut the yard, rake up leaves. He has good appetite, sleeps a lot. No agitations. He is taking Aricept 10 mg daily, Namenda 10 mg twice a day  REVIEW OF SYSTEMS: Full 14 system review of systems performed and notable only for those listed, all others are neg:  Memory loss   ALLERGIES: No Known Allergies  HOME MEDICATIONS: Outpatient Medications Prior to Visit  Medication Sig Dispense Refill  . aspirin 81 MG tablet Take 81 mg by mouth daily.    . Blood Glucose Monitoring Suppl (ONE TOUCH ULTRA 2) W/DEVICE KIT Use to check blood sugars twice a day Dx E11.9 1 each 0  . donepezil (ARICEPT) 10 MG tablet TAKE 1 TABLET BY MOUTH DAILY 30 tablet 5  . glucose blood (ONE TOUCH ULTRA TEST) test strip CHECK  BLOOD SUGAR TWICE DAILY 100 each 12  . Insulin Detemir (LEVEMIR FLEXTOUCH) 100 UNIT/ML Pen Inject 20 Units into the skin daily at 10 pm. (Patient taking differently: Inject 15 Units into the skin daily at 10 pm. ) 15 mL 6  . Insulin Pen Needle (B-D ULTRAFINE III SHORT PEN) 31G X 8 MM MISC Use daily as directed. 100 each 12  . memantine (NAMENDA) 10 MG tablet Take 1 tablet (10 mg total) by mouth 2 (two) times daily. 60 tablet 11  . metFORMIN (GLUCOPHAGE) 500 MG tablet Take 1 tablet (500 mg total) by mouth 2 (two) times daily with a meal. 60 tablet 5  . ONETOUCH DELICA LANCETS FINE MISC 1 Units by Does not apply route as directed. Use to help check blood sugars twice a day Dx E11.9 100 each 3   No facility-administered medications prior to visit.     PAST MEDICAL HISTORY: Past Medical History:  Diagnosis Date  . Anemia   . Diabetes mellitus    TYPE 2  . Elevated PSA   . FH: colonic polyps   . Hyperlipidemia   . Memory loss   . Vegetarian     PAST SURGICAL HISTORY: Past Surgical History:  Procedure Laterality Date  . CATARACT EXTRACTION    . COLONOSCOPY W/ POLYPECTOMY    . FOREIGN BODY OS    . HEMORRHOID SURGERY    .  TRANSURETHRAL RESECTION OF PROSTATE N/A 09/13/2014   did not find cancer    FAMILY HISTORY: Family History  Problem Relation Age of Onset  . Cancer Sister     BREAST  . Diabetes Maternal Aunt   . Stroke Neg Hx   . Hearing loss Neg Hx   . Colon cancer Neg Hx   . Prostate cancer Neg Hx     SOCIAL HISTORY: Social History   Social History  . Marital status: Married    Spouse name: Delrose  . Number of children: 1  . Years of education: 51   Occupational History  . SCHOOL SYSTEM; [employer] History Main Topics  . Smoking status: Never Smoker  . Smokeless tobacco: Never Used  . Alcohol use No  . Drug use: No  . Sexual activity: Not on file   Other Topics Concern  . 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        Right handed.   Caffeine - one cup daily.   Patient has one child.     PHYSICAL EXAM  Vitals:   01/16/16 0913  BP: 122/75  Pulse: 69  Weight: 176 lb (79.8 kg)  Height: '6\' 1"'  (1.854 m)   Body mass index is 23.22 kg/m.   PHYSICAL EXAMNIATION:  Gen: NAD, conversant, well nourised, obese, well groomed                     Cardiovascular: Regular rate rhythm, no peripheral edema, warm, nontender. Eyes: Conjunctivae clear without exudates or hemorrhage Neck: Supple, no carotid bruits. Pulmonary: Clear to auscultation bilaterally   NEUROLOGICAL EXAM:  MENTAL STATUS: Speech:    Speech is normal; fluent and spontaneous with normal comprehension.  Cognition: MMSE 15/30, animal naming 5.     Orientation: He is not oriented to date, year, place,     recent and remote memory: He missed 3/3 recalls     Attention span and concentration: He has difficulty focusing     Normal Language, naming, repeating,spontaneous speech     Fund of knowledge   CRANIAL NERVES: CN II: Visual fields are full to confrontation. Fundoscopic exam is normal with sharp discs and no vascular changes. Pupils are round equal and briskly reactive to light. CN III, IV, VI: extraocular movement are normal. No ptosis. CN V: Facial sensation is intact to pinprick in all 3 divisions bilaterally. Corneal responses are intact.  CN VII: Face is symmetric with normal eye closure and smile. CN VIII: Hearing is normal to rubbing fingers CN IX, X: Palate elevates symmetrically. Phonation is normal. CN XI: Head turning and shoulder shrug are intact CN XII: Tongue is midline with normal movements and no atrophy.  MOTOR: There is no pronator drift of out-stretched arms. Muscle bulk and tone are normal. Muscle strength is  normal.  REFLEXES: Reflexes are 2+ and symmetric at the biceps, triceps, knees, and ankles. Plantar responses are flexor.  SENSORY: Intact to light touch, pinprick, positional and vibratory sensation are intact in fingers and toes.  COORDINATION: Rapid alternating movements and fine finger movements are intact. There is no dysmetria on finger-to-nose and heel-knee-shin.    GAIT/STANCE: Posture is normal. Gait is steady with normal steps, base, arm  swing, and turning. Heel and toe walking are normal. Tandem gait is normal.  Romberg is absent.        DIAGNOSTIC DATA (LABS, IMAGING, TESTING) - I reviewed patient records, labs, notes, testing and imaging myself where available.  Lab Results  Component Value Date   WBC 3.3 (L) 07/04/2015   HGB 12.2 (L) 07/04/2015   HCT 36.5 (L) 07/04/2015   MCV 91.8 07/04/2015   PLT 187.0 07/04/2015      Component Value Date/Time   NA 141 07/04/2015 1158   K 4.0 07/04/2015 1158   CL 105 07/04/2015 1158   CO2 32 07/04/2015 1158   GLUCOSE 112 (H) 07/04/2015 1158   BUN 11 07/04/2015 1158   CREATININE 0.82 07/04/2015 1158   CALCIUM 10.0 07/04/2015 1158   PROT 7.0 01/31/2015 1422   ALBUMIN 3.7 01/31/2015 1422   AST 15 01/31/2015 1422   ALT 17 01/31/2015 1422   ALKPHOS 73 01/31/2015 1422   BILITOT 0.3 01/31/2015 1422   GFRNONAA 145.01 04/26/2009 0851   GFRAA 146 11/17/2007 0000   Lab Results  Component Value Date   CHOL 184 01/31/2015   HDL 61.90 01/31/2015   LDLCALC 99 01/31/2015   TRIG 117.0 01/31/2015   CHOLHDL 3 01/31/2015   Lab Results  Component Value Date   HGBA1C 5.9 12/05/2015   Lab Results  Component Value Date   [id number] 630 02/18/2012   Lab Results  Component Value Date   TSH 1.88 01/31/2015      ASSESSMENT AND PLAN 75 y.o. year old male  Dementia without behavioral issues  Progressively worse  Continue Namenda 10 mg twice a day, Aricept 10 mg daily   Marcial Pacas, M.D. Ph.D.  [facility] [place], [place] [address] Phone: [phone] Fax:      [phone]

## 2016-04-12 ENCOUNTER — Other Ambulatory Visit: Payer: Self-pay | Admitting: Internal Medicine

## 2016-05-26 ENCOUNTER — Other Ambulatory Visit: Payer: Self-pay | Admitting: Internal Medicine

## 2016-06-04 ENCOUNTER — Ambulatory Visit: Payer: Medicare Other | Admitting: Internal Medicine

## 2016-06-17 ENCOUNTER — Telehealth: Payer: Self-pay | Admitting: *Deleted

## 2016-06-17 NOTE — Telephone Encounter (Signed)
Pt to come in for AWV at 1030 instead of 10am. Wife notified and agreeable.

## 2016-06-17 NOTE — Progress Notes (Signed)
 Subjective:   Blake Wilcox is a 75 y.o. male who presents for Medicare Annual/Subsequent preventive examination.  Pt with alzheimer's-accompanied by wife and son   Review of Systems: No ROS.  Medicare Wellness Visit. Cardiac Risk Factors include: advanced age (>55men, >65 women);diabetes mellitus;dyslipidemia;hypertension Sleep patterns:Sleeps well. Feel rested. Home Safety/Smoke Alarms: Feels safe in home. Smoke alarms in place.   Living environment; residence and Firearm Safety: Lives with wife and 2 sons. No stairs.  Seat Belt Safety/Bike Helmet: Wears seat belt.   Counseling:   Eye Exam- Wears glasses for reading. Dr.Shapiro annually. Dental- Dr.Upchurch annually.  Male:   CCS- Will discuss with Dr.Paz today.     PSA-  Lab Results  Component Value Date   PSA 9.25 (H) 02/18/2012       Objective:    Vitals: BP 128/80 (BP Location: Left Arm, Patient Position: Sitting, Cuff Size: Normal)   Pulse 94   Ht 6' 1" (1.854 m)   Wt 179 lb (81.2 kg)   SpO2 97%   BMI 23.62 kg/m   Body mass index is 23.62 kg/m.  Tobacco History  Smoking Status  . Never Smoker  Smokeless Tobacco  . Never Used     Counseling given: Not Answered   Past Medical History:  Diagnosis Date  . Anemia   . Diabetes mellitus    TYPE 2  . Elevated PSA   . FH: colonic polyps   . Hyperlipidemia   . Memory loss   . Vegetarian    Past Surgical History:  Procedure Laterality Date  . CATARACT EXTRACTION    . COLONOSCOPY W/ POLYPECTOMY    . FOREIGN BODY OS    . HEMORRHOID SURGERY    . TRANSURETHRAL RESECTION OF PROSTATE N/A 09/13/2014   did not find cancer   Family History  Problem Relation Age of Onset  . Cancer Sister     BREAST  . Diabetes Maternal Aunt   . Stroke Neg Hx   . Hearing loss Neg Hx   . Colon cancer Neg Hx   . Prostate cancer Neg Hx    History  Sexual Activity  . Sexual activity: Not on file    Outpatient Encounter Prescriptions as of 06/18/2016  Medication  Sig  . aspirin 81 MG tablet Take 81 mg by mouth daily.  . Blood Glucose Monitoring Suppl (ONE TOUCH ULTRA 2) W/DEVICE KIT Use to check blood sugars twice a day Dx E11.9  . donepezil (ARICEPT) 10 MG tablet Take 1 tablet (10 mg total) by mouth daily.  . Insulin Detemir (LEVEMIR FLEXTOUCH) 100 UNIT/ML Pen Inject 15 Units into the skin daily at 10 pm.  . Insulin Pen Needle (B-D ULTRAFINE III SHORT PEN) 31G X 8 MM MISC Use daily as directed.  . memantine (NAMENDA) 10 MG tablet Take 1 tablet (10 mg total) by mouth 2 (two) times daily.  . metFORMIN (GLUCOPHAGE) 500 MG tablet Take 1 tablet (500 mg total) by mouth 2 (two) times daily with a meal.  . ONE TOUCH ULTRA TEST test strip CHECK BLOOD SUGAR TWICE DAILY  . ONETOUCH DELICA LANCETS FINE MISC 1 Units by Does not apply route as directed. Use to help check blood sugars twice a day Dx E11.9   No facility-administered encounter medications on file as of 06/18/2016.     Activities of Daily Living In your present state of health, do you have any difficulty performing the following activities: 06/18/2016 07/04/2015  Hearing? N N  Vision? N N    Difficulty concentrating or making decisions? N Y  Walking or climbing stairs? N N  Dressing or bathing? N N  Doing errands, shopping? Tempie Donning  Preparing Food and eating ? N -  Using the Toilet? N -  In the past six months, have you accidently leaked urine? Y -  Do you have problems with loss of bowel control? N -  Managing your Medications? N -  Managing your Finances? N -  Housekeeping or managing your Housekeeping? N -  Some recent data might be hidden    Patient Care Team: Colon Branch, MD as PCP - General (Internal Medicine)   Assessment:    Physical assessment deferred to PCP.  Exercise Activities and Dietary recommendations Current Exercise Habits: Home exercise routine, Type of exercise: walking, Frequency (Times/Week): 3, Intensity: Mild   Diet (meal preparation, eat out, water intake, caffeinated  beverages, dairy products, fruits and vegetables): in general, a "healthy" diet     Goals      Patient Stated   . Maintain current lifestyle (pt-stated)      Fall Risk Fall Risk  06/18/2016 07/04/2015 01/17/2015  Falls in the past year? No No No   Depression Screen PHQ 2/9 Scores 06/18/2016 07/04/2015 01/17/2015  PHQ - 2 Score 0 0 0    Cognitive Function MMSE - Mini Mental State Exam 06/18/2016 01/16/2016 07/18/2015 01/24/2015 01/17/2015  Not completed: Unable to complete - - - -  Orientation to time - 1 0 1 4  Orientation to Place - _0 Registration - _1 Attention/ Calculation - 0 _2 Recall - 0 0 0 0  Language- name 2 objects - _3 Language- repeat - 1 0 1 1  Language- follow 3 step command - _4 Language- read & follow direction - _5 Write a sentence - _6 Copy design - 0 _7 Total score - _8 Immunization History  Administered Date(s) Administered  . Influenza Split 11/18/2010  . Influenza Whole 01/11/2012  . Influenza, High Dose Seasonal PF 01/17/2015, 12/05/2015  . Influenza-Unspecified 11/10/2012  . Pneumococcal Conjugate-13 01/31/2015  . Pneumococcal Polysaccharide-23 07/04/2015  . Td 02/10/1997, 11/17/2007  . Zoster 04/04/2015   Screening Tests Health Maintenance  Topic Date Due  . COLONOSCOPY  02/07/1993  . URINE MICROALBUMIN  01/31/2016  . OPHTHALMOLOGY EXAM  03/07/2016  . HEMOGLOBIN A1C  06/04/2016  . INFLUENZA VACCINE  09/10/2016  . FOOT EXAM  06/18/2017  . TETANUS/TDAP  11/16/2017  . PNA vac Low Risk Adult  Completed      Plan:   Follow up with PCP today as scheduled.  I have personally reviewed and noted the following in the patient's chart:   . Medical and social history . Use of alcohol, tobacco or illicit drugs  . Current medications and supplements . Functional ability and status . Nutritional status . Physical activity . Advanced directives . List of other  physicians . Hospitalizations, surgeries, and ER visits in previous 12 months . Vitals . Screenings to include cognitive, depression, and falls . Referrals and appointments  In addition, I have reviewed and discussed with patient certain preventive protocols, quality metrics, and best practice recommendations. A written personalized care plan for preventive services as well as general preventive health recommendations were provided to patient.  ,  Angel, RN  06/18/2016  Jose Paz, MD  

## 2016-06-17 NOTE — Progress Notes (Signed)
Pre visit review using our clinic review tool, if applicable. No additional management support is needed unless otherwise documented below in the visit note. 

## 2016-06-18 ENCOUNTER — Encounter: Payer: Self-pay | Admitting: Internal Medicine

## 2016-06-18 ENCOUNTER — Ambulatory Visit (INDEPENDENT_AMBULATORY_CARE_PROVIDER_SITE_OTHER): Payer: Medicare Other | Admitting: Internal Medicine

## 2016-06-18 VITALS — BP 128/80 | HR 94 | Ht 73.0 in | Wt 179.0 lb

## 2016-06-18 DIAGNOSIS — E1169 Type 2 diabetes mellitus with other specified complication: Secondary | ICD-10-CM

## 2016-06-18 DIAGNOSIS — Z794 Long term (current) use of insulin: Secondary | ICD-10-CM

## 2016-06-18 DIAGNOSIS — Z0000 Encounter for general adult medical examination without abnormal findings: Secondary | ICD-10-CM

## 2016-06-18 DIAGNOSIS — I10 Essential (primary) hypertension: Secondary | ICD-10-CM

## 2016-06-18 LAB — COMPREHENSIVE METABOLIC PANEL
ALBUMIN: 4.3 g/dL (ref 3.5–5.2)
ALK PHOS: 52 U/L (ref 39–117)
ALT: 17 U/L (ref 0–53)
AST: 13 U/L (ref 0–37)
BUN: 11 mg/dL (ref 6–23)
CALCIUM: 10.3 mg/dL (ref 8.4–10.5)
CHLORIDE: 104 meq/L (ref 96–112)
CO2: 28 meq/L (ref 19–32)
Creatinine, Ser: 0.9 mg/dL (ref 0.40–1.50)
GFR: 106.27 mL/min (ref >60.00)
Glucose, Bld: 96 mg/dL (ref 70–99)
POTASSIUM: 3.9 meq/L (ref 3.5–5.1)
SODIUM: 140 meq/L (ref 135–145)
Total Bilirubin: 0.4 mg/dL (ref 0.2–1.2)
Total Protein: 7.4 g/dL (ref 6.0–8.3)

## 2016-06-18 LAB — CBC WITH DIFFERENTIAL/PLATELET
Basophils Absolute: 0 10*3/uL (ref 0.0–0.1)
Basophils Relative: 0.8 % (ref 0.0–3.0)
EOS ABS: 0.1 10*3/uL (ref 0.0–0.7)
EOS PCT: 2.3 % (ref 0.0–5.0)
HCT: 37.6 % — ABNORMAL LOW (ref 39.0–52.0)
Hemoglobin: 12.7 g/dL — ABNORMAL LOW (ref 13.0–17.0)
LYMPHS ABS: 1.5 10*3/uL (ref 0.7–4.0)
Lymphocytes Relative: 36.3 % (ref 12.0–46.0)
MCHC: 33.7 g/dL (ref 30.0–36.0)
MCV: 93.6 fl (ref 78.0–100.0)
MONO ABS: 0.5 10*3/uL (ref 0.1–1.0)
Monocytes Relative: 11.5 % (ref 3.0–12.0)
NEUTROS PCT: 49.1 % (ref 43.0–77.0)
Neutro Abs: 2 10*3/uL (ref 1.4–7.7)
Platelets: 258 10*3/uL (ref 150.0–400.0)
RBC: 4.01 Mil/uL — AB (ref 4.22–5.81)
RDW: 13.1 % (ref 11.5–15.5)
WBC: 4.2 10*3/uL (ref 4.0–10.5)

## 2016-06-18 LAB — LIPID PANEL
CHOL/HDL RATIO: 3
Cholesterol: 203 mg/dL — ABNORMAL HIGH (ref 0–200)
HDL: 67.4 mg/dL (ref >39.00)
LDL CALC: 109 mg/dL — AB (ref 0–99)
NONHDL: 135.12
Triglycerides: 130 mg/dL (ref 0.0–149.0)
VLDL: 26 mg/dL (ref 0.0–40.0)

## 2016-06-18 LAB — MICROALBUMIN / CREATININE URINE RATIO
CREATININE, U: 237.3 mg/dL
MICROALB UR: 54.3 mg/dL — AB (ref 0.0–1.9)
MICROALB/CREAT RATIO: 22.9 mg/g (ref 0.0–30.0)

## 2016-06-18 LAB — HEMOGLOBIN A1C: HEMOGLOBIN A1C: 6.9 % — AB (ref 4.6–6.5)

## 2016-06-18 NOTE — Patient Instructions (Addendum)
GO TO THE LAB : Get the blood work     GO TO THE FRONT DESK Schedule your next appointment for a  routine checkup in 6 months  Call if the blood sugars are consistently less than 100 in the morning.       [name] , Thank you for taking time to come for your Medicare Wellness Visit. I appreciate your ongoing commitment to your health goals. Please review the following plan we discussed and let me know if I can assist you in the future.   These are the goals we discussed: Goals      Patient Stated   . Maintain current lifestyle (pt-stated)       This is a list of the screening recommended for you and due dates:  Health Maintenance  Topic Date Due  . Colon Cancer Screening  02/07/1993  . Urine Protein Check  01/31/2016  . Eye exam for diabetics  03/07/2016  . Hemoglobin A1C  06/04/2016  . Flu Shot  09/10/2016  . Complete foot exam   06/18/2017  . Tetanus Vaccine  11/16/2017  . Pneumonia vaccines  Completed

## 2016-06-18 NOTE — Assessment & Plan Note (Signed)
Td 2009; Zostavax 2-17  ;  pnm 23-- 08-2015;  pnm 13--12-16. Shingrex  discussed, will hold off since he just had a Zostavax --CCS: see previous entry, no CCS d/t general health, son and wife in agreement -- Sees urology regularly.recent hematuria, to have a CT-cysto --labs: CMP, FLP, CBC, A1c

## 2016-06-18 NOTE — Progress Notes (Signed)
Subjective:    Patient ID: Blake Wilcox, male    DOB: August 15, 1940, 76 y.o.   MRN: [id number]  DOS:  06/18/2016 Type of visit - description : cpx Interval history: Here with his son and wife. In general doing  about the same except for the fact that dementia is gradually worse. Also, had gross hematuria, went to see urology, they're planning a CT scan-cystoscopy.   Review of Systems Patient has advanced dementia, he needs help managing all his medication. He needs to be supervised otherwise he is known to  wander outside the house. Other than that he is very pleasent with no violent behavior.  Has hematuria but family is not aware of fever, chills or stomach pain. In fact they don't report any other problems.  Other than above, a 14 point review of systems is negative      Past Medical History:  Diagnosis Date  . Anemia   . Diabetes mellitus    TYPE 2  . Elevated PSA   . FH: colonic polyps   . Hyperlipidemia   . Memory loss   . Vegetarian     Past Surgical History:  Procedure Laterality Date  . CATARACT EXTRACTION    . COLONOSCOPY W/ POLYPECTOMY    . FOREIGN BODY OS    . HEMORRHOID SURGERY    . TRANSURETHRAL RESECTION OF PROSTATE N/A 09/13/2014   did not find cancer    Social History   Social History  . Marital status: Married    Spouse name: Delrose  . Number of children: 1  . Years of education: 21   Occupational History  . retired [employer] History Main Topics  . Smoking status: Never Smoker  . Smokeless tobacco: Never Used  . Alcohol use No  . Drug use: No  . Sexual activity: Not on file   Other Topics Concern  . 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        Right handed.   Caffeine - one cup daily.   Patient has  one child.     Family History  Problem Relation Age of Onset  . Cancer Sister        BREAST  . Diabetes Maternal Aunt   . Stroke Neg Hx   . Hearing loss Neg Hx   . Colon cancer Neg Hx   . Prostate cancer Neg Hx      Allergies as of 06/18/2016   No Known Allergies     Medication List       Accurate as of 06/18/16 11:59 PM. Always use your most recent med list.          aspirin 81 MG tablet Take 81 mg by mouth daily.   donepezil 10 MG tablet Commonly known as:  ARICEPT Take 1 tablet (10 mg total) by mouth daily.   Insulin Detemir 100 UNIT/ML Pen Commonly known as:  LEVEMIR FLEXTOUCH Inject 15 Units into the skin daily at 10 pm.   Insulin Pen Needle 31G X 8 MM Misc Commonly known as:  B-D ULTRAFINE III SHORT PEN Use daily as directed.   memantine 10 MG tablet Commonly known as:  NAMENDA Take 1 tablet (10 mg total) by mouth 2 (two) times  daily.   metFORMIN 500 MG tablet Commonly known as:  GLUCOPHAGE Take 1 tablet (500 mg total) by mouth 2 (two) times daily with a meal.   ONE TOUCH ULTRA 2 w/Device Kit Use to check blood sugars twice a day Dx E11.9   ONE TOUCH ULTRA TEST test strip Generic drug:  glucose blood CHECK BLOOD SUGAR TWICE DAILY   ONETOUCH DELICA LANCETS FINE Misc 1 Units by Does not apply route as directed. Use to help check blood sugars twice a day Dx E11.9          Objective:   Physical Exam BP 128/80 (BP Location: Left Arm, Patient Position: Sitting, Cuff Size: Normal)   Pulse 94   Ht _0  (1.854 m)   Wt 179 lb (81.2 kg)   SpO2 97%   BMI 23.62 kg/m   General:   Well developed, well nourished . NAD.  Neck: No  thyromegaly  HEENT:  Normocephalic . Face symmetric, atraumatic Lungs:  CTA B Normal respiratory effort, no intercostal retractions, no accessory muscle use. Heart: RRR,  no murmur.  No pretibial edema bilaterally  Abdomen:  Not distended, soft, non-tender. No rebound or rigidity.   Skin: Exposed areas without rash. Not  pale. Not jaundice Neurologic:  alert , recently demented, follow simple commands.Marland Kitchen  gait appropriate for age and unassisted Strength symmetric and appropriate for age.  Psych: No anxious or depressed appearing.    Assessment & Plan:   Assessment DM Hyperlipidemia BPH TURP 2016 Dr Hazle Nordmann Progressive dementia, f/u [facility],neurology   son-- [name]   PLAN DM: Continue with Lantus 15 units and metformin. CBGs ranged from 110-140. Check A1c and micro. Goal is to avoid hypoglycemia or hyperglycemia. Hyperlipidemia: Diet control, checking labs BPH and recent gross hematuria: per urology, they are planning a CT and cystoscopy History of anemia:  D/w family that under normal circumstances when somebody has anemia the next step is to do a GI referral to rule out colon cancer or other serious conditions, the family don't see the patient being able to tolerate a Cscope, and they don't like him to go through the prep.   Dementia: Last MMSE at neuro 14. He is  progressively worse.  On Aricept and Namenda. RTC 6 months

## 2016-06-19 NOTE — Assessment & Plan Note (Signed)
DM: Continue with Lantus 15 units and metformin. CBGs ranged from 110-140. Check A1c and micro. Goal is to avoid hypoglycemia or hyperglycemia. Hyperlipidemia: Diet control, checking labs BPH and recent gross hematuria: per urology, they are planning a CT and cystoscopy History of anemia:  D/w family that under normal circumstances when somebody has anemia the next step is to do a GI referral to rule out colon cancer or other serious conditions, the family don't see the patient being able to tolerate a Cscope, and they don't like him to go through the prep.   Dementia: Last MMSE at neuro 14. He is  progressively worse.  On Aricept and Namenda. RTC 6 months

## 2016-07-26 ENCOUNTER — Other Ambulatory Visit: Payer: Self-pay | Admitting: Nurse Practitioner

## 2016-07-26 ENCOUNTER — Other Ambulatory Visit: Payer: Self-pay | Admitting: Internal Medicine

## 2016-08-04 LAB — HM DIABETES EYE EXAM

## 2016-08-10 ENCOUNTER — Other Ambulatory Visit: Payer: Self-pay | Admitting: Internal Medicine

## 2016-08-15 ENCOUNTER — Encounter: Payer: Self-pay | Admitting: Internal Medicine

## 2016-08-23 ENCOUNTER — Other Ambulatory Visit: Payer: Self-pay | Admitting: Internal Medicine

## 2016-11-01 ENCOUNTER — Encounter: Payer: Self-pay | Admitting: Internal Medicine

## 2016-11-03 MED ORDER — ONETOUCH DELICA LANCETS FINE MISC: 12 refills | Status: DC

## 2016-12-17 ENCOUNTER — Ambulatory Visit: Payer: Medicare Other | Admitting: Internal Medicine

## 2016-12-17 ENCOUNTER — Encounter: Payer: Self-pay | Admitting: Internal Medicine

## 2016-12-17 VITALS — BP 132/78 | HR 81 | Temp 98.2°F | Resp 14 | Ht 73.0 in | Wt 194.1 lb

## 2016-12-17 DIAGNOSIS — Z794 Long term (current) use of insulin: Secondary | ICD-10-CM | POA: Diagnosis not present

## 2016-12-17 DIAGNOSIS — N453 Epididymo-orchitis: Secondary | ICD-10-CM

## 2016-12-17 DIAGNOSIS — E1169 Type 2 diabetes mellitus with other specified complication: Secondary | ICD-10-CM

## 2016-12-17 NOTE — Assessment & Plan Note (Signed)
Acute epididmo orchitis, UTI Acute onset of left testicular pain and swelling, went to the ER yesterday, DX with acute epididymo orchitis, currently on Levaquin 750 for 10 days (dose is a slightly higher that the usual 500 mg daily).  He seems to be better.  Symptoms started  3 days better after a cystoscopy as reported by the family. Plan: Continue Levaquin, definitely go to the ER if they see any evidence of swelling, redness at the GU area.  Asked him to notify Dr. Jeralyn BennettPushinsky. Will fax him my note as well  DM: Last A1c 6.9 but blood sugar has been in the 300s for the last few months despite taking metformin and insulin. Plan: Increase Levemir to 23 units, monitor sugars, go up to 3 units q 3 days until CBGs ~ 150.  See AVS. RTC 2 months

## 2016-12-17 NOTE — Patient Instructions (Addendum)
  GO TO THE FRONT DESK Schedule your next appointment for a checkup in 2 months  Increase Levemir to 23 units every day  Check blood sugars every morning.   Increase Levemir by 3 units every third day until fasting sugar is less than 150.  Watch for low blood sugar symptoms  Other medications the same  Call your urologist this week regards to infection  ER if fever, increased swelling, redness or pain at the genital area

## 2016-12-17 NOTE — Progress Notes (Signed)
Subjective:    Patient ID: Blake Wilcox, male    DOB: [birth year], 76 y.o.   MRN: [id number]  DOS:  12/17/2016 Type of visit - description : ED f/u . Here for f/u Interval history: [name] to see his urologist, 12-12-16 had a cystoscopy according to the family, I do not see records in [facility].  He was prescribed Bactrim but never started. He was doing well until yesterday when they patient's son noted that he was in some discomfort.  Patient has dementia and could not explain much.  The son noted swelling and discomfort at the genital area consequently  took him to the ER. At the ER he was assessed:. CBG 339, was given insulin UA: + RBC, WBC CMP ok CBC ok  Scrotal ultrasound showed:    1. Hyperemia of the left testis and epididymis consistent with epididymo-orchitis. 2. Large complex hydrocele on the left may represent hematoma or infection. 3. 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 Suggest follow-up imaging in 6 months. 4. Left testicular calcifications. Current literature suggests that testicular microlithiasis is not a significant independent risk factor for development of testicular carcinoma, and that follow up imaging is not warranted in the absence of other risk factors. Monthly testicular self-examination and annual physical exams are considered appropriate surveillance. If patient has other risk factors for testicular carcinoma, then referral to Urology should be considered.5. Small bilateral varicoceles.   Review of Systems Since he left the ER, he started taking antibiotics. The patient's son reported that he looks better, seems to be in less pain. No fever chills No nausea, vomiting, diarrhea. Also, good compliance with insulin however for the last 3 months the  CBGs have been elevated: Usually in the 300, never less than  215.   Past Medical History:  Diagnosis Date  . Anemia   . Diabetes mellitus    TYPE 2  . Elevated PSA   . FH: colonic polyps   . Hyperlipidemia   . Memory loss   . Vegetarian     Past Surgical History:  Procedure Laterality Date  . CATARACT EXTRACTION    . COLONOSCOPY W/ POLYPECTOMY    . FOREIGN BODY OS    . HEMORRHOID SURGERY    . TRANSURETHRAL RESECTION OF PROSTATE N/A 09/13/2014   did not find cancer    Social History   Socioeconomic History  . Marital status: Married    Spouse name: Blake Wilcox  . Number of children: 1  . Years of education: 6  . Highest education level: Not on file  Social Needs  . Financial resource strain: Not on file  . Food insecurity - worry: Not on file  . Food insecurity - inability: Not on file  . Transportation needs - medical: Not on file  . Transportation needs - non-medical: Not on file  Occupational History  . Occupation: retired    Fish farm manager: Psychologist, sport and exercise [employer]  Tobacco Use  . Smoking status: Never Smoker  . Smokeless tobacco: Never Used  Substance and Sexual Activity  . Alcohol use: No    Alcohol/week: 0.0 oz  . Drug use: No  . Sexual activity: Not on file  Other Topics Concern  . 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        Right handed.   Caffeine - one cup daily.   Patient has one child.      Allergies as of 12/17/2016   No Known Allergies     Medication List        Accurate as of 12/17/16  9:05 PM. Always use your most recent med list.          aspirin 81 MG tablet Take 81 mg by mouth daily.   donepezil 10 MG tablet Commonly known as:  ARICEPT Take 1 tablet (10 mg total) by mouth daily.   glucose blood test strip Commonly known as:  ONE TOUCH ULTRA TEST Check blood sugar twice daily.   Insulin  Pen Needle 31G X 8 MM Misc Commonly known as:  B-D ULTRAFINE III SHORT PEN To use w/ Levemir daily   LEVEMIR FLEXTOUCH 100 UNIT/ML Pen Generic drug:  Insulin Detemir Inject 23 Units daily at 10 pm into the skin.   levofloxacin 750 MG tablet Commonly known as:  LEVAQUIN Take 750 mg daily by mouth.   memantine 10 MG tablet Commonly known as:  NAMENDA Take 1 tablet (10 mg total) by mouth 2 (two) times daily.   metFORMIN 500 MG tablet Commonly known as:  GLUCOPHAGE Take 1 tablet (500 mg total) by mouth 2 (two) times daily with a meal.   ONE TOUCH ULTRA 2 w/Device Kit Use to check blood sugars twice a day Dx Z61.0   ONETOUCH DELICA LANCETS FINE Misc Use to help check blood sugars twice a day   sulfamethoxazole-trimethoprim 800-160 MG tablet Commonly known as:  BACTRIM DS,SEPTRA DS Take 1 tablet 2 (two) times daily by mouth.          Objective:   Physical Exam BP 132/78 (BP Location: Left Arm, Patient Position: Sitting, Cuff Size: Small)   Pulse 81   Temp 98.2 F (36.8 C) (Oral)   Resp 14   Ht _0  (1.854 m)   Wt 194 lb 2 oz (88.1 kg)   SpO2 91%   BMI 25.61 kg/m  General:   Well developed, well nourished . NAD.  HEENT:  Normocephalic . Face symmetric, atraumatic Lungs:  CTA B Normal respiratory effort, no intercostal retractions, no accessory muscle use. Heart: RRR,  no murmur.  Trace peri-ankle edema bilaterally  Skin: Not pale. Not jaundice GU: Penis without lesions, dripping normal color urine. Right testicle normal Left testicle, epididimus: enlarged, tender, somewhat hard. Otherwise the GU area without swelling, crepitus, redness, warmness. Neurologic:  Alert, pleasantly demented , gait appropriate for age and unassisted Psych--  No anxious or depressed appearing.      Assessment & Plan:     Assessment DM Hyperlipidemia BPH TURP 2016 Dr Hazle Nordmann Progressive dementia, f/u [facility],neurology   son-- Blake Wilcox   PLAN Acute epididmo orchitis,  UTI Acute onset of left testicular pain and swelling, went to the ER yesterday, DX with acute epididymo orchitis, currently on Levaquin 750 for 10 days (dose is a slightly higher that the usual 500 mg daily).  He seems to be better.  Symptoms started  3 days better after a cystoscopy as reported by the family. Plan: Continue Levaquin, definitely go to the ER if they see any evidence of swelling, redness at the GU area.  Asked him to notify Dr. Hazle Nordmann. Will fax him my note as well  DM: Last A1c 6.9 but blood sugar has been in the  300s for the last few months despite taking metformin and insulin. Plan: Increase Levemir to 23 units, monitor sugars, go up to 3 units q 3 days until CBGs ~ 150.  See AVS. RTC 2 months

## 2016-12-17 NOTE — Progress Notes (Signed)
Pre visit review using our clinic review tool, if applicable. No additional management support is needed unless otherwise documented below in the visit note. 

## 2016-12-24 ENCOUNTER — Encounter: Payer: Medicare Other | Admitting: Internal Medicine

## 2016-12-25 ENCOUNTER — Telehealth: Payer: Self-pay | Admitting: Internal Medicine

## 2016-12-25 NOTE — Telephone Encounter (Signed)
Recently diagnosed with epididymoorchitis.. I spoke with the patient's wife today, reports he is doing better, swelling has decreased, no fever or chills.  Apparently he already saw his urologist and has another follow-up this week.  Asked her to call me if there is questions.

## 2017-01-07 ENCOUNTER — Encounter: Payer: Self-pay | Admitting: Internal Medicine

## 2017-01-07 ENCOUNTER — Ambulatory Visit: Payer: Medicare Other | Admitting: Internal Medicine

## 2017-01-07 ENCOUNTER — Ambulatory Visit (HOSPITAL_BASED_OUTPATIENT_CLINIC_OR_DEPARTMENT_OTHER)
Admission: RE | Admit: 2017-01-07 | Discharge: 2017-01-07 | Disposition: A | Discharge status: Home / Self Care | Payer: Medicare Other | Source: Ambulatory Visit | Attending: Internal Medicine | Admitting: Internal Medicine

## 2017-01-07 VITALS — BP 122/78 | HR 77 | Temp 98.1°F | Resp 14 | Ht 73.0 in | Wt 198.0 lb

## 2017-01-07 DIAGNOSIS — M25551 Pain in right hip: Secondary | ICD-10-CM

## 2017-01-07 NOTE — Patient Instructions (Signed)
Get the XR at the first floor  Tylenol  500 mg OTC 2 tabs a day every 8 hours as needed for pain  Call if no better in few days  Call if severe symptoms, fever, chills

## 2017-01-07 NOTE — Progress Notes (Signed)
Subjective:    Patient ID: Blake Wilcox, male    DOB: 17-Jul-1940, 76 y.o.   MRN: [id number]  DOS:  01/07/2017 Type of visit - description : acute Interval history: Here w/ wife and son Pt c/o to his family about  pain at the R leg , sx started 4 days ago, pt has dementia, family couldn't give more details. Pt points to the lateral side of the R hip and lateral R leg Was almost unable to walk 3 days ago, better today Also had orchitis: better   Review of Systems  ROS limited by dementia but denies knee pain per se  No falls that the family knows No rash No F/C   Past Medical History:  Diagnosis Date  . Anemia   . Diabetes mellitus    TYPE 2  . Elevated PSA   . FH: colonic polyps   . Hyperlipidemia   . Memory loss   . Vegetarian     Past Surgical History:  Procedure Laterality Date  . CATARACT EXTRACTION    . COLONOSCOPY W/ POLYPECTOMY    . FOREIGN BODY OS    . HEMORRHOID SURGERY    . TRANSURETHRAL RESECTION OF PROSTATE N/A 09/13/2014   did not find cancer    Social History   Socioeconomic History  . Marital status: Married    Spouse name: Delrose  . Number of children: 1  . Years of education: 60  . Highest education level: Not on file  Social Needs  . Financial resource strain: Not on file  . Food insecurity - worry: Not on file  . Food insecurity - inability: Not on file  . Transportation needs - medical: Not on file  . Transportation needs - non-medical: Not on file  Occupational History  . Occupation: retired    Fish farm manager: Psychologist, sport and exercise [employer]  Tobacco Use  . Smoking status: Never Smoker  . Smokeless tobacco: Never Used  Substance and Sexual Activity  . Alcohol use: No    Alcohol/week: 0.0 oz  . Drug use: No  . Sexual activity: Not on file  Other Topics Concern  . 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        Right handed.   Caffeine - one cup daily.   Patient has one child.      Allergies as of 01/07/2017   No Known Allergies     Medication List        Accurate as of 01/07/17  2:10 PM. Always use your most recent med list.          aspirin 81 MG tablet Take 81 mg by mouth daily.   donepezil 10 MG tablet Commonly known as:  ARICEPT Take 1 tablet (10 mg total) by mouth daily.   glucose blood test strip Commonly known as:  ONE TOUCH ULTRA TEST Check blood sugar twice daily.   Insulin Pen Needle 31G X 8 MM Misc Commonly known as:  B-D ULTRAFINE III SHORT PEN To use w/ Levemir daily   LEVEMIR FLEXTOUCH 100 UNIT/ML Pen Generic drug:  Insulin Detemir Inject 23 Units daily at 10 pm into the skin.   memantine 10 MG tablet Commonly known as:  NAMENDA Take 1 tablet (10 mg total) by mouth 2 (  two) times daily.   metFORMIN 500 MG tablet Commonly known as:  GLUCOPHAGE Take 1 tablet (500 mg total) by mouth 2 (two) times daily with a meal.   ONE TOUCH ULTRA 2 w/Device Kit Use to check blood sugars twice a day Dx L73.7   ONETOUCH DELICA LANCETS FINE Misc Use to help check blood sugars twice a day   sulfamethoxazole-trimethoprim 800-160 MG tablet Commonly known as:  BACTRIM DS,SEPTRA DS Take 1 tablet 2 (two) times daily by mouth.          Objective:   Physical Exam BP 122/78 (BP Location: Left Arm, Patient Position: Sitting, Cuff Size: Small)   Pulse 77   Temp 98.1 F (36.7 C) (Oral)   Resp 14   Ht _0  (1.854 m)   Wt 198 lb (89.8 kg)   SpO2 92%   BMI 26.12 kg/m  General:   Well developed, well nourished . NAD.  HEENT:  Normocephalic . Face symmetric, atraumatic MSK: Knees: no effusion , redness  Not TTP at T-L-S spine or buttocks L hip normal R hip: ROM is ok, some discomfort noted w/ wt bearing , walks w/ a mild limp LE motor intact   GU: L testicle still enlarged and firm  but better than before  Skin: No rash buttocks or R LE Neurologic:  alert & oriented X3.  Speech normal, gait : unassisted, mild limp; LE DTRs symmetric  Psych--  Cognition and judgment appear intact.  Cooperative with normal attention span and concentration.  Behavior appropriate. No anxious or depressed appearing.      Assessment & Plan:   Assessment DM Hyperlipidemia BPH TURP 2016 Dr Hazle Nordmann Progressive dementia, f/u [facility],neurology   son-- [name]   PLAN R leg pain : unclear etiology, hip pain? Radiculopathy? He is getting better, see HPI REC XR hip, tylenol, observation, see AVS  Acute epididmo orchitis, UTI : improving

## 2017-01-07 NOTE — Assessment & Plan Note (Signed)
R leg pain : unclear etiology, hip pain? Radiculopathy? He is getting better, see HPI REC XR hip, tylenol, observation, see AVS  Acute epididmo orchitis, UTI : improving

## 2017-01-07 NOTE — Progress Notes (Signed)
Pre visit review using our clinic review tool, if applicable. No additional management support is needed unless otherwise documented below in the visit note. 

## 2017-01-19 ENCOUNTER — Ambulatory Visit: Payer: Medicare Other | Admitting: Neurology

## 2017-01-20 ENCOUNTER — Telehealth: Payer: Self-pay | Admitting: *Deleted

## 2017-01-20 NOTE — Telephone Encounter (Signed)
Left message notifying that the patient's appt has been canceled due to inclement weather.  Provided our number to call back to reschedule his yearly follow up.  This appt can be made with Dr. Terrace ArabiaYan or Eber Jonesarolyn.

## 2017-01-21 ENCOUNTER — Ambulatory Visit: Payer: Medicare Other | Admitting: Neurology

## 2017-01-25 ENCOUNTER — Other Ambulatory Visit: Payer: Self-pay | Admitting: Neurology

## 2017-02-18 ENCOUNTER — Ambulatory Visit: Payer: Medicare Other | Admitting: Internal Medicine

## 2017-02-18 ENCOUNTER — Telehealth: Payer: Self-pay | Admitting: Internal Medicine

## 2017-02-18 MED ORDER — INSULIN DETEMIR 100 UNIT/ML FLEXPEN: 23.0000 [IU] | PEN_INJECTOR | Freq: Every day | SUBCUTANEOUS | 3 refills | Status: DC

## 2017-02-18 NOTE — Telephone Encounter (Signed)
Copied from CRM [phone]. Topic: Quick Communication - See Telephone Encounter >> Feb 18, 2017 10:34 AM Valentina LucksMatos, Jackelin wrote: CRM for notification. See Telephone encounter for:  02/18/17.   Pt is needing refill on Insulin Detemir (LEVEMIR FLEXTOUCH) 100 UNIT/ML Pen since pt is completely out of insulin- pt did mentioned that his dose was changed on his last visit, pt rescheduled his appt for Wednesday February 26, 2016 since by mistake did hit the wrong boton on tel to confirm his appt that was today and by mistake it was canceled. Please advise.

## 2017-02-18 NOTE — Telephone Encounter (Signed)
Levemir was increased to 23 units, increasing 3 units every 3 days until CBGs ~ 150. Rx refilled to [facility].

## 2017-02-21 ENCOUNTER — Other Ambulatory Visit: Payer: Self-pay | Admitting: Internal Medicine

## 2017-02-21 ENCOUNTER — Other Ambulatory Visit: Payer: Self-pay | Admitting: Neurology

## 2017-02-24 ENCOUNTER — Encounter: Payer: Self-pay | Admitting: Internal Medicine

## 2017-02-24 ENCOUNTER — Ambulatory Visit: Payer: Medicare Other | Admitting: Internal Medicine

## 2017-02-24 VITALS — BP 124/82 | HR 94 | Temp 97.7°F | Resp 14 | Ht 73.0 in | Wt 195.0 lb

## 2017-02-24 DIAGNOSIS — Z794 Long term (current) use of insulin: Secondary | ICD-10-CM

## 2017-02-24 DIAGNOSIS — E1169 Type 2 diabetes mellitus with other specified complication: Secondary | ICD-10-CM

## 2017-02-24 LAB — BASIC METABOLIC PANEL
BUN: 19 mg/dL (ref 6–23)
CHLORIDE: 99 meq/L (ref 96–112)
CO2: 30 mEq/L (ref 19–32)
CREATININE: 0.9 mg/dL (ref 0.40–1.50)
Calcium: 9.9 mg/dL (ref 8.4–10.5)
GFR: 106.07 mL/min (ref >60.00)
Glucose, Bld: 278 mg/dL — ABNORMAL HIGH (ref 70–99)
POTASSIUM: 4.3 meq/L (ref 3.5–5.1)
SODIUM: 137 meq/L (ref 135–145)

## 2017-02-24 LAB — HEMOGLOBIN A1C: HEMOGLOBIN A1C: 9.3 % — AB (ref 4.6–6.5)

## 2017-02-24 MED ORDER — INSULIN DETEMIR 100 UNIT/ML FLEXPEN: 35.0000 [IU] | PEN_INJECTOR | Freq: Every day | SUBCUTANEOUS | 3 refills | Status: DC

## 2017-02-24 NOTE — Progress Notes (Signed)
Pre visit review using our clinic review tool, if applicable. No additional management support is needed unless otherwise documented below in the visit note. 

## 2017-02-24 NOTE — Assessment & Plan Note (Signed)
DM: On metformin and Levemir.  Since the last visit his son gradually increase Levemir up to 35 units daily.  His blood sugar was never less than 120 but he then decided that that was probably too much and went down to 25 units. With that dose, CBG definitely increased to above 200s, one  time was 540.  He was more confused that day.  Since then he elected to go back on 35 units a day and the patient is doing well. We will continue with 35 units of Levemir, I encouraged the patient to watch and look for typical and atypical symptoms of hypoglycemia which were discussed in detail.  If he is ever hypoglycemic he will get some sugar. CBG goals 120 to  200. Check BMP and A1c. Addendum: Will be out of Levemir for a few days, ? samples, I was able to provide BASAGLAR pen, will do only 30 units a day while on basaglar, teach son use of the pen. RTC 3 months

## 2017-02-24 NOTE — Patient Instructions (Addendum)
GO TO THE LAB : Get the blood work     GO TO THE FRONT DESK Schedule your next appointment for a  Check up in 3 months    Diabetes: Check your blood sugar  Once or twice a day   GOALS: Fasting before a meal > 120 2 hours after a meal less than ~ 200 Call if consistently not at goal   Check the  blood pressure 2 or 3 times a   Week  Be sure your blood pressure is between 110/65 and  135/85. If it is consistently higher or lower, let me know  Watch for low sugar symptoms    Hypoglycemia Hypoglycemia is when the sugar (glucose) level in the blood is too low. Symptoms of low blood sugar may include:  Feeling: ? Hungry. ? Worried or nervous (anxious). ? Sweaty and clammy. ? Confused. ? Dizzy. ? Sleepy. ? Sick to your stomach (nauseous).  Having: ? A fast heartbeat. ? A headache. ? A change in your vision. ? Jerky movements that you cannot control (seizure). ? Nightmares. ? Tingling or no feeling (numbness) around the mouth, lips, or tongue.  Having trouble with: ? Talking. ? Paying attention (concentrating). ? Moving (coordination). ? Sleeping.  Shaking.  Passing out (fainting).  Getting upset easily (irritability).  Low blood sugar can happen to people who have diabetes and people who do not have diabetes. Low blood sugar can happen quickly, and it can be an emergency. Treating Low Blood Sugar Low blood sugar is often treated by eating or drinking something sugary right away. If you can think clearly and swallow safely, follow the 15:15 rule:  Take 15 grams of a fast-acting carb (carbohydrate). Some fast-acting carbs are: ? 1 tube of glucose gel. ? 3 sugar tablets (glucose pills). ? 6-8 pieces of hard candy. ? 4 oz (120 mL) of fruit juice. ? 4 oz (120 mL) of regular (not diet) soda.  Check your blood sugar 15 minutes after you take the carb.  If your blood sugar is still at or below 70 mg/dL (3.9 mmol/L), take 15 grams of a carb again.  If your blood  sugar does not go above 70 mg/dL (3.9 mmol/L) after 3 tries, get help right away.  After your blood sugar goes back to normal, eat a meal or a snack within 1 hour.  Treating Very Low Blood Sugar If your blood sugar is at or below 54 mg/dL (3 mmol/L), you have very low blood sugar (severe hypoglycemia). This is an emergency. Do not wait to see if the symptoms will go away. Get medical help right away. Call your local emergency services (911 in the [place] Do not drive yourself to the hospital. If you have very low blood sugar and you cannot eat or drink, you may need a glucagon shot (injection). A family member or friend should learn how to check your blood sugar and how to give you a glucagon shot. Ask your doctor if you need to have a glucagon shot kit at home. Follow these instructions at home: General instructions  Avoid any diets that cause you to not eat enough food. Talk with your doctor before you start any new diet.  Take over-the-counter and prescription medicines only as told by your doctor.  Limit alcohol to no more than 1 drink per day for nonpregnant women and 2 drinks per day for men. One drink equals 12 oz of beer, 5 oz of wine, or 1 oz of hard liquor.  Keep all follow-up visits as told by your doctor. This is important. If You Have Diabetes:   Make sure you know the symptoms of low blood sugar.  Always keep a source of sugar with you, such as: ? Sugar. ? Sugar tablets. ? Glucose gel. ? Fruit juice. ? Regular soda (not diet soda). ? Milk. ? Hard candy. ? Honey.  Take your medicines as told.  Follow your exercise and meal plan. ? Eat on time. Do not skip meals. ? Follow your sick day plan when you cannot eat or drink normally. Make this plan ahead of time with your doctor.  Check your blood sugar as often as told by your doctor. Always check before and after exercise.  Share your diabetes care plan with: ? Your work or school. ? People you live with.  Check  your pee (urine) for ketones: ? When you are sick. ? As told by your doctor.  Carry a card or wear jewelry that says you have diabetes. If You Have Low Blood Sugar From Other Causes:   Check your blood sugar as often as told by your doctor.  Follow instructions from your doctor about what you cannot eat or drink. Contact a doctor if:  You have trouble keeping your blood sugar in your target range.  You have low blood sugar often. Get help right away if:  You still have symptoms after you eat or drink something sugary.  Your blood sugar is at or below 54 mg/dL (3 mmol/L).  You have jerky movements that you cannot control.  You pass out. These symptoms may be an emergency. Do not wait to see if the symptoms will go away. Get medical help right away. Call your local emergency services (911 in the [place] Do not drive yourself to the hospital. This information is not intended to replace advice given to you by your health care provider. Make sure you discuss any questions you have with your health care provider. Document Released: 04/23/2009 Document Revised: 07/05/2015 Document Reviewed: 03/02/2015 Elsevier Interactive Patient Education  [employer].

## 2017-02-24 NOTE — Progress Notes (Signed)
Subjective:    Patient ID: Blake Wilcox, male    DOB: 10-Jan-1941, 77 y.o.   MRN: [id number]  DOS:  02/24/2017 Type of visit - description : rov, here with his son and wife Interval history: DM: Good compliance with insulin, see below    Review of Systems No fever chills Occasional hematuria, not a new issue Appetite is excellent I asked about hypoglycemia, CBG has never been less than 120.  He has dementia and from time to time he is confused. He has short episodes that he feels really weak and cannot stand up by himself and he is less responsive.  I wonder if that is hypoglycemia.  Past Medical History:  Diagnosis Date  . Anemia   . Diabetes mellitus    TYPE 2  . Elevated PSA   . FH: colonic polyps   . Hyperlipidemia   . Memory loss   . Vegetarian     Past Surgical History:  Procedure Laterality Date  . CATARACT EXTRACTION    . COLONOSCOPY W/ POLYPECTOMY    . FOREIGN BODY OS    . HEMORRHOID SURGERY    . TRANSURETHRAL RESECTION OF PROSTATE N/A 09/13/2014   did not find cancer    Social History   Socioeconomic History  . Marital status: Married    Spouse name: Delrose  . Number of children: 1  . Years of education: 7  . Highest education level: Not on file  Social Needs  . Financial resource strain: Not on file  . Food insecurity - worry: Not on file  . Food insecurity - inability: Not on file  . Transportation needs - medical: Not on file  . Transportation needs - non-medical: Not on file  Occupational History  . Occupation: retired    Fish farm manager: Psychologist, sport and exercise [employer]  Tobacco Use  . Smoking status: Never Smoker  . Smokeless tobacco: Never Used  Substance and Sexual Activity  . Alcohol use: No    Alcohol/week: 0.0 oz  . Drug use: No  . Sexual activity: Not on file  Other Topics Concern  . 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        Right handed.   Caffeine - one cup daily.   Patient has one child.      Allergies as of 02/24/2017   No Known Allergies     Medication List        Accurate as of 02/24/17  5:36 PM. Always use your most recent med list.          aspirin 81 MG tablet Take 81 mg by mouth daily.   donepezil 10 MG tablet Commonly known as:  ARICEPT TAKE 1 TABLET(10 MG) BY MOUTH DAILY   glucose blood test strip Commonly known as:  ONE TOUCH ULTRA TEST Check blood sugar twice daily.   Insulin Detemir 100 UNIT/ML Pen Commonly known as:  LEVEMIR FLEXTOUCH Inject 35 Units into the skin daily at 10 pm. NEEDS A 3 MONTH SUPPLY   Insulin Pen Needle 31G X 8 MM Misc Commonly known as:  B-D ULTRAFINE III SHORT PEN To use w/ Levemir daily   memantine 10 MG tablet Commonly known as:  NAMENDA TAKE 1 TABLET(10 MG) BY MOUTH TWICE DAILY   metFORMIN  500 MG tablet Commonly known as:  GLUCOPHAGE Take 1 tablet (500 mg total) by mouth 2 (two) times daily with a meal.   ONE TOUCH ULTRA 2 w/Device Kit Use to check blood sugars twice a day Dx Y81.8   ONETOUCH DELICA LANCETS FINE Misc Use to help check blood sugars twice a day          Objective:   Physical Exam BP 124/82 (BP Location: Left Arm, Patient Position: Sitting, Cuff Size: Small)   Pulse 94   Temp 97.7 F (36.5 C) (Oral)   Resp 14   Ht '6\' 1"'  (1.854 m)   Wt 195 lb (88.5 kg)   SpO2 94%   BMI 25.73 kg/m  General:   Well developed, well nourished . NAD.  HEENT:  Normocephalic . Face symmetric, atraumatic Lungs:  CTA B Normal respiratory effort, no intercostal retractions, no accessory muscle use. Heart: RRR,  no murmur.  Trace peri-ankle edema bilaterally  Skin: Not pale. Not jaundice Neurologic:  Alert, pleasantly demented , gait appropriate for age and unassisted Psych--  No anxious or depressed appearing.     Assessment &  Plan:     Assessment DM Hyperlipidemia BPH TURP 2016 Dr Hazle Nordmann Progressive dementia, f/u [facility],neurology   son-- [name]   PLAN DM: On metformin and Levemir.  Since the last visit his son gradually increase Levemir up to 35 units daily.  His blood sugar was never less than 120 but he then decided that that was probably too much and went down to 25 units. With that dose, CBG definitely increased to above 200s, one  time was 540.  He was more confused that day.  Since then he elected to go back on 35 units a day and the patient is doing well. We will continue with 35 units of Levemir, I encouraged the patient to watch and look for typical and atypical symptoms of hypoglycemia which were discussed in detail.  If he is ever hypoglycemic he will get some sugar. CBG goals 120 to  200. Check BMP and A1c. Addendum: Will be out of Levemir for a few days, ? samples, I was able to provide BASAGLAR pen, will do only 30 units a day while on basaglar, teach son use of the pen. RTC 3 months

## 2017-02-25 ENCOUNTER — Ambulatory Visit: Payer: Medicare Other | Admitting: Internal Medicine

## 2017-04-01 ENCOUNTER — Ambulatory Visit: Payer: Medicare Other | Admitting: Neurology

## 2017-04-01 ENCOUNTER — Encounter: Payer: Self-pay | Admitting: Neurology

## 2017-04-01 VITALS — BP 131/87 | HR 85 | Ht 73.0 in | Wt 194.0 lb

## 2017-04-01 DIAGNOSIS — G309 Alzheimer's disease, unspecified: Secondary | ICD-10-CM | POA: Diagnosis not present

## 2017-04-01 DIAGNOSIS — F0280 Dementia in other diseases classified elsewhere without behavioral disturbance: Secondary | ICD-10-CM | POA: Diagnosis not present

## 2017-04-01 MED ORDER — MEMANTINE HCL 10 MG PO TABS: 10.0000 mg | ORAL_TABLET | Freq: Two times a day (BID) | ORAL | 4 refills | Status: DC

## 2017-04-01 MED ORDER — DONEPEZIL HCL 10 MG PO TABS: 10.0000 mg | ORAL_TABLET | Freq: Every day | ORAL | 4 refills | Status: DC

## 2017-04-01 NOTE — Patient Instructions (Signed)
[facility]   [facility] day [facility] in [place], [place] Address: [address], [place], [place] [address] Phone: [phone] 

## 2017-04-01 NOTE — Progress Notes (Signed)
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  In December 2013, he had excessive polyuria, he urinate every 15 minutes, went to Dr. Linna Darner, was found to have elevated glucose, A1c 15.1, he was started on diabetes medications, but since that time, he was also noticed to have mild short-term memory trouble, he could not remember where he puts things, but he is still able to drive, and go to work, he denies visual loss, no lateralized motor or sensory deficit CT head was normal. Normal TSH, B12.   UPDATE June 22nd 2016: Last clinical visit was in December 2015 with [name], he is now taking Aricept 10 mg daily, which seems to help his memory. He also use insulin, his son Blake Wilcox inject him every night He lives with his family, he still drives short distance, watches TV, MMSE 18 out of 74 today   UPDATE Dec 6th 2017: He is with his son and daughter, he lives with his wife and his son, he stays home, cut the yard, rake up leaves. He has good appetite, sleeps a lot. No agitations. He is taking Aricept 10 mg daily, Namenda 10 mg twice a day  UPDATE Apr 01 2017: Patient continued to decline, worsening memory loss, mobility, at the end of 2018 also had worsening bowel and bladder incontinence, wears diapers now, he lives with his wife and son, sleeps most of the time, mild agitation,  A1c in January 2019 9.3, creatinine 0.9,  REVIEW OF SYSTEMS: Full 14 system review of systems performed and notable only for those listed, all others are neg:  Memory loss   ALLERGIES: No Known Allergies  HOME MEDICATIONS: Outpatient Medications Prior to Visit  Medication Sig Dispense Refill  . aspirin 81 MG  tablet Take 81 mg by mouth daily.    . Blood Glucose Monitoring Suppl (ONE TOUCH ULTRA 2) W/DEVICE KIT Use to check blood sugars twice a day Dx E11.9 1 each 0  . donepezil (ARICEPT) 10 MG tablet TAKE 1 TABLET(10 MG) BY MOUTH DAILY 90 tablet 0  . glucose blood (ONE TOUCH ULTRA TEST) test strip Check blood sugar twice daily. 100 each 12  . Insulin Detemir (LEVEMIR FLEXTOUCH) 100 UNIT/ML Pen Inject 35 Units into the skin daily at 10 pm. NEEDS A 3 MONTH SUPPLY 15 mL 3  . Insulin Pen Needle (B-D ULTRAFINE III SHORT PEN) 31G X 8 MM MISC To use w/ Levemir daily 100 each 12  . memantine (NAMENDA) 10 MG tablet TAKE 1 TABLET(10 MG) BY MOUTH TWICE DAILY 180 tablet 0  . metFORMIN (GLUCOPHAGE) 500 MG tablet Take 1 tablet (500 mg total) by mouth 2 (two) times daily with a meal. 60 tablet 5  . Multiple Vitamin (MULTIVITAMIN) tablet Take 1 tablet by mouth daily.    Glory Rosebush DELICA LANCETS FINE MISC Use to help check blood sugars twice a day 100 each 12   No facility-administered medications prior to visit.     PAST MEDICAL HISTORY: Past Medical History:  Diagnosis Date  . Anemia   . Diabetes mellitus    TYPE 2  . Elevated PSA   . FH: colonic polyps   . Hyperlipidemia   . Memory loss   .  Vegetarian     PAST SURGICAL HISTORY: Past Surgical History:  Procedure Laterality Date  . CATARACT EXTRACTION    . COLONOSCOPY W/ POLYPECTOMY    . FOREIGN BODY OS    . HEMORRHOID SURGERY    . TRANSURETHRAL RESECTION OF PROSTATE N/A 09/13/2014   did not find cancer    FAMILY HISTORY: Family History  Problem Relation Age of Onset  . Cancer Sister        BREAST  . Diabetes Maternal Aunt   . Stroke Neg Hx   . Hearing loss Neg Hx   . Colon cancer Neg Hx   . Prostate cancer Neg Hx     SOCIAL HISTORY: Social History   Socioeconomic History  . Marital status: Married    Spouse name: Blake Wilcox  . Number of children: 1  . Years of education: 72  . Highest education level: Not on file  Social Needs    . Financial resource strain: Not on file  . Food insecurity - worry: Not on file  . Food insecurity - inability: Not on file  . Transportation needs - medical: Not on file  . Transportation needs - non-medical: Not on file  Occupational History  . Occupation: retired    Fish farm manager: Psychologist, sport and exercise [employer]  Tobacco Use  . Smoking status: Never Smoker  . Smokeless tobacco: Never Used  Substance and Sexual Activity  . Alcohol use: No    Alcohol/week: 0.0 oz  . Drug use: No  . Sexual activity: Not on file  Other Topics Concern  . 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        Right handed.   Caffeine - one cup daily.   Patient has one child.     PHYSICAL EXAM  Vitals:   04/01/17 0837  BP: 131/87  Pulse: 85  Weight: 194 lb (88 kg)  Height: _0  (1.854 m)   Body mass index is 25.6 kg/m.   PHYSICAL EXAMNIATION:  Gen: NAD, conversant, well nourised, obese, well groomed                     Cardiovascular: Regular rate rhythm, no peripheral edema, warm, nontender. Eyes: Conjunctivae clear without exudates or hemorrhage Neck: Supple, no carotid bruits. Pulmonary: Clear to auscultation bilaterally   NEUROLOGICAL EXAM:  MMSE - Mini Mental State Exam 04/01/2017 06/18/2016 06/18/2016  Not completed: - (No Data) Unable to complete  Orientation to time 0 - -  Orientation to Place 1 - -  Registration 3 - -  Attention/ Calculation 0 - -  Recall 0 - -  Language- name 2 objects 2 - -  Language- repeat 1 - -  Language- follow 3 step command 2 - -  Language- read & follow direction 0 - -  Write a sentence 0 - -  Copy design 0 - -  Total score 9 - -  Animal naming 1   CRANIAL NERVES: CN II: Visual fields are full to confrontation. Pupils are round equal and briskly reactive to  light. CN III, IV, VI: extraocular movement are normal. No ptosis. CN V: Facial sensation is intact to pinprick in all 3 divisions bilaterally. Corneal responses are intact.  CN VII: Face is symmetric with normal eye closure and  smile. CN VIII: Hearing is normal to rubbing fingers CN IX, X: Palate elevates symmetrically. Phonation is normal. CN XI: Head turning and shoulder shrug are intact CN XII: Tongue is midline with normal movements and no atrophy.  MOTOR: Mild limb rigidity, left hand intrinsic muscle atrophy, especially first dorsal interossei, with relative preservation of left abductor pollicis brevis, weak grip,  REFLEXES: Reflexes are 2+ and symmetric at the biceps, triceps, knees, and ankles. Plantar responses are flexor.  SENSORY: Withdraws to pain  COORDINATION: No dysmetria noted  GAIT/STANCE: He needs to push up to get up from sitting to position, leaning forward, stiff, cautious, unsteady gait       DIAGNOSTIC DATA (LABS, IMAGING, TESTING) - I reviewed patient records, labs, notes, testing and imaging myself where available.  Lab Results  Component Value Date   WBC 4.2 06/18/2016   HGB 12.7 (L) 06/18/2016   HCT 37.6 (L) 06/18/2016   MCV 93.6 06/18/2016   PLT 258.0 06/18/2016      Component Value Date/Time   NA 137 02/24/2017 1034   K 4.3 02/24/2017 1034   CL 99 02/24/2017 1034   CO2 30 02/24/2017 1034   GLUCOSE 278 (H) 02/24/2017 1034   BUN 19 02/24/2017 1034   CREATININE 0.90 02/24/2017 1034   CALCIUM 9.9 02/24/2017 1034   PROT 7.4 06/18/2016 1148   ALBUMIN 4.3 06/18/2016 1148   AST 13 06/18/2016 1148   ALT 17 06/18/2016 1148   ALKPHOS 52 06/18/2016 1148   BILITOT 0.4 06/18/2016 1148   GFRNONAA 145.01 04/26/2009 0851   GFRAA 146 11/17/2007 0000   Lab Results  Component Value Date   CHOL 203 (H) 06/18/2016   HDL 67.40 06/18/2016   LDLCALC 109 (H) 06/18/2016   TRIG 130.0 06/18/2016   CHOLHDL 3 06/18/2016   Lab Results  Component Value  Date   HGBA1C 9.3 (H) 02/24/2017   Lab Results  Component Value Date   ZXYDSWVT91 504 02/18/2012   Lab Results  Component Value Date   TSH 1.88 01/31/2015      ASSESSMENT AND PLAN 77 y.o. year old male  Dementia   Progressively worse, Mini-Mental Status Examination is only 9out of 30 today,  Continue Namenda 10 mg twice a day, Aricept 10 mg daily  I have provided him with information of the pace at [facility]   Marcial Pacas, M.D. Ph.D.  [facility] [address], [place] [address] Phone: [phone] Fax:      [phone]

## 2017-05-27 ENCOUNTER — Encounter: Payer: Self-pay | Admitting: Internal Medicine

## 2017-05-27 ENCOUNTER — Ambulatory Visit: Payer: Medicare Other | Admitting: Internal Medicine

## 2017-05-27 VITALS — BP 129/71 | HR 85 | Temp 98.1°F | Resp 16 | Ht 72.0 in | Wt 190.4 lb

## 2017-05-27 DIAGNOSIS — E1169 Type 2 diabetes mellitus with other specified complication: Secondary | ICD-10-CM | POA: Diagnosis not present

## 2017-05-27 DIAGNOSIS — Z794 Long term (current) use of insulin: Secondary | ICD-10-CM | POA: Diagnosis not present

## 2017-05-27 DIAGNOSIS — E119 Type 2 diabetes mellitus without complications: Secondary | ICD-10-CM

## 2017-05-27 LAB — BASIC METABOLIC PANEL
BUN: 15 mg/dL (ref 6–23)
CO2: 27 mEq/L (ref 19–32)
Calcium: 9.9 mg/dL (ref 8.4–10.5)
Chloride: 100 mEq/L (ref 96–112)
Creatinine, Ser: 0.89 mg/dL (ref 0.40–1.50)
GFR: 107.37 mL/min (ref >60.00)
Glucose, Bld: 271 mg/dL — ABNORMAL HIGH (ref 70–99)
POTASSIUM: 4.2 meq/L (ref 3.5–5.1)
SODIUM: 135 meq/L (ref 135–145)

## 2017-05-27 LAB — HEMOGLOBIN A1C: HEMOGLOBIN A1C: 8.1 % — AB (ref 4.6–6.5)

## 2017-05-27 NOTE — Patient Instructions (Signed)
GO TO THE LAB : Get the blood work     GO TO THE FRONT DESK Schedule your next appointment for a checkup in 3 or 4 months  Increase metformin: 2 tablets breakfast, one with dinner  Continue with same insulin  Ideal sugar is between 100-200  Watch for low sugar symptoms

## 2017-05-27 NOTE — Progress Notes (Signed)
Subjective:    Patient ID: Blake Wilcox, male    DOB: 10/23/1940, 77 y.o.   MRN: [id number]  DOS:  05/27/2017 Type of visit - description : f/u Interval history: Here with his son and wife. Blake Wilcox reports that his blood sugars in the morning mostly in the 120-155. From time to time they go up to 230, 260. I tried to figure out why the CBGs change so much and is probably d/t recurring UTIs (closely follow-up by urology); last visit with them was last week.  He had antibiotics.  Review of Systems Currently with no fever chills.  No nausea, vomiting, diarrhea  Past Medical History:  Diagnosis Date  . Anemia   . Diabetes mellitus    TYPE 2  . Elevated PSA   . FH: colonic polyps   . Hyperlipidemia   . Memory loss   . Vegetarian     Past Surgical History:  Procedure Laterality Date  . CATARACT EXTRACTION    . COLONOSCOPY W/ POLYPECTOMY    . FOREIGN BODY OS    . HEMORRHOID SURGERY    . TRANSURETHRAL RESECTION OF PROSTATE N/A 09/13/2014   did not find cancer    Social History   Socioeconomic History  . Marital status: Married    Spouse name: Blake Wilcox  . Number of children: 1  . Years of education: 1  . Highest education level: Not on file  Occupational History  . Occupation: retired    Fish farm manager: [employer]  Social Needs  . Financial resource strain: Not on file  . Food insecurity:    Worry: Not on file    Inability: Not on file  . Transportation needs:    Medical: Not on file    Non-medical: Not on file  Tobacco Use  . Smoking status: Never Smoker  . Smokeless tobacco: Never Used  Substance and Sexual Activity  . Alcohol use: No    Alcohol/week: 0.0 oz  . Drug use: No  . Sexual activity: Not on file  Lifestyle  . Physical activity:    Days per week: Not on file    Minutes per session: Not on file  . Stress: Not on file  Relationships  . Social connections:    Talks on phone: Not on file    Gets together: Not on file    Attends  religious service: Not on file    Active member of club or organization: Not on file    Attends meetings of clubs or organizations: Not on file    Relationship status: Not on file  . Intimate partner violence:    Fear of current or ex partner: Not on file    Emotionally abused: Not on file    Physically abused: Not on file    Forced sexual activity: Not on file  Other Topics Concern  . 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        Right handed.   Caffeine - one cup daily.   Patient has one child.      Allergies as of 05/27/2017   No Known Allergies     Medication List        Accurate as of 05/27/17 11:59 PM. Always use your most recent med list.  aspirin 81 MG tablet Take 81 mg by mouth daily.   donepezil 10 MG tablet Commonly known as:  ARICEPT Take 1 tablet (10 mg total) by mouth at bedtime.   glucose blood test strip Commonly known as:  ONE TOUCH ULTRA TEST Check blood sugar twice daily.   Insulin Detemir 100 UNIT/ML Pen Commonly known as:  LEVEMIR FLEXTOUCH Inject 35 Units into the skin daily at 10 pm. NEEDS A 3 MONTH SUPPLY   Insulin Pen Needle 31G X 8 MM Misc Commonly known as:  B-D ULTRAFINE III SHORT PEN To use w/ Levemir daily   memantine 10 MG tablet Commonly known as:  NAMENDA Take 1 tablet (10 mg total) by mouth 2 (two) times daily.   metFORMIN 500 MG tablet Commonly known as:  GLUCOPHAGE Take by mouth. 2 tablets in the morning and 1 tablet at night   multivitamin tablet Take 1 tablet by mouth daily.   ONE TOUCH ULTRA 2 w/Device Kit Use to check blood sugars twice a day Dx H68.0   ONETOUCH DELICA LANCETS FINE Misc Use to help check blood sugars twice a day          Objective:   Physical Exam BP 129/71 (BP Location: Right  Arm, Patient Position: Sitting, Cuff Size: Small)   Pulse 85   Temp 98.1 F (36.7 C) (Oral)   Resp 16   Ht 6' (1.829 m)   Wt 190 lb 6.4 oz (86.4 kg)   SpO2 100%   BMI 25.82 kg/m  General:   Well developed, well nourished . NAD.  HEENT:  Normocephalic . Face symmetric, atraumatic Lungs:  CTA B Normal respiratory effort, no intercostal retractions, no accessory muscle use. Heart: RRR,  no murmur.  No pretibial edema bilaterally  Skin: Not pale. Not jaundice Neurologic:  alert, pleasantly demented Psych--  behavior appropriate.    Assessment & Plan:    Assessment DM Hyperlipidemia BPH TURP 2016 Dr Hazle Nordmann Progressive dementia, f/u [facility],neurology   son-- Blake Wilcox   PLAN DM: Most morning sugars 120, 150.  Occasionally they go to the mid 200s.  For now continue Levemir 35 units.  Will increase metformin 500 mg B.I.D. to 1000 mg am, 500 pm. Again discussed w/ son: goal is not to have his blood sugar too tight.  Hypoglycemia symptoms discussed again today. Check a A1c and BMP. Also recommend to check for infections when CBGs are elevated, check with his urologist (recurrent UTIs) or me RTC 3-4 months

## 2017-05-29 NOTE — Assessment & Plan Note (Signed)
DM: Most morning sugars 120, 150.  Occasionally they go to the mid 200s.  For now continue Levemir 35 units.  Will increase metformin 500 mg B.I.D. to 1000 mg am, 500 pm. Again discussed w/ son: goal is not to have his blood sugar too tight.  Hypoglycemia symptoms discussed again today. Check a A1c and BMP. Also recommend to check for infections when CBGs are elevated, check with his urologist (recurrent UTIs) or me RTC 3-4 months

## 2017-06-17 NOTE — Progress Notes (Deleted)
Subjective:   Blake Wilcox is a 77 y.o. male who presents for Medicare Annual/Subsequent preventive examination.  Review of Systems: No ROS.  Medicare Wellness Visit. Additional risk factors are reflected in the social history.    Sleep patterns:  Home Safety/Smoke Alarms: Feels safe in home. Smoke alarms in place.  Living environment; residence and Firearm Safety:   Male:   CCS-     PSA-  Lab Results  Component Value Date   PSA 9.25 (H) 02/18/2012       Objective:    Vitals: There were no vitals taken for this visit.  There is no height or weight on file to calculate BMI.  Advanced Directives 06/18/2016 01/17/2015  Does Patient Have a Medical Advance Directive? No No  Would patient like information on creating a medical advance directive? Yes (MAU/Ambulatory/Procedural Areas - Information given) (No Data)    Tobacco Social History   Tobacco Use  Smoking Status Never Smoker  Smokeless Tobacco Never Used     Counseling given: Not Answered   Clinical Intake:                       Past Medical History:  Diagnosis Date  . Anemia   . Diabetes mellitus    TYPE 2  . Elevated PSA   . FH: colonic polyps   . Hyperlipidemia   . Memory loss   . Vegetarian    Past Surgical History:  Procedure Laterality Date  . CATARACT EXTRACTION    . COLONOSCOPY W/ POLYPECTOMY    . FOREIGN BODY OS    . HEMORRHOID SURGERY    . TRANSURETHRAL RESECTION OF PROSTATE N/A 09/13/2014   did not find cancer   Family History  Problem Relation Age of Onset  . Cancer Sister        BREAST  . Diabetes Maternal Aunt   . Stroke Neg Hx   . Hearing loss Neg Hx   . Colon cancer Neg Hx   . Prostate cancer Neg Hx    Social History   Socioeconomic History  . Marital status: Married    Spouse name: Delrose  . Number of children: 1  . Years of education: 35  . Highest education level: Not on file  Occupational History  . Occupation: retired    Fish farm manager: [employer]  Social Needs  . Financial resource strain: Not on file  . Food insecurity:    Worry: Not on file    Inability: Not on file  . Transportation needs:    Medical: Not on file    Non-medical: Not on file  Tobacco Use  . Smoking status: Never Smoker  . Smokeless tobacco: Never Used  Substance and Sexual Activity  . Alcohol use: No    Alcohol/week: 0.0 oz  . Drug use: No  . Sexual activity: Not on file  Lifestyle  . Physical activity:    Days per week: Not on file    Minutes per session: Not on file  . Stress: Not on file  Relationships  . Social connections:    Talks on phone: Not on file    Gets together: Not on file    Attends religious service: Not on file    Active member of club or organization: Not on file    Attends meetings of clubs or organizations: Not on file    Relationship status: Not on file  Other Topics Concern  . 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        Right handed.   Caffeine - one cup daily.   Patient has one child.    Outpatient Encounter Medications as of 06/19/2017  Medication Sig  . aspirin 81 MG tablet Take 81 mg by mouth daily.  . Blood Glucose Monitoring Suppl (ONE TOUCH ULTRA 2) W/DEVICE KIT Use to check blood sugars twice a day Dx E11.9  . donepezil (ARICEPT) 10 MG tablet Take 1 tablet (10 mg total) by mouth at bedtime.  Marland Kitchen glucose blood (ONE TOUCH ULTRA TEST) test strip Check blood sugar twice daily.  . Insulin Detemir (LEVEMIR FLEXTOUCH) 100 UNIT/ML Pen Inject 35 Units into the skin daily at 10 pm. NEEDS A 3 MONTH SUPPLY  . Insulin Pen Needle (B-D ULTRAFINE III SHORT PEN) 31G X 8 MM MISC To use w/ Levemir daily  . memantine (NAMENDA) 10 MG tablet Take 1 tablet (10 mg total) by mouth 2 (two) times daily.  . metFORMIN  (GLUCOPHAGE) 500 MG tablet Take by mouth. 2 tablets in the morning and 1 tablet at night  . Multiple Vitamin (MULTIVITAMIN) tablet Take 1 tablet by mouth daily.  Glory Rosebush DELICA LANCETS FINE MISC Use to help check blood sugars twice a day   No facility-administered encounter medications on file as of 06/19/2017.     Activities of Daily Living In your present state of health, do you have any difficulty performing the following activities: 06/18/2016  Hearing? N  Vision? N  Difficulty concentrating or making decisions? N  Walking or climbing stairs? N  Dressing or bathing? N  Doing errands, shopping? Y  Comment pt no longer drives  Preparing Food and eating ? N  Using the Toilet? N  In the past six months, have you accidently leaked urine? Y  Do you have problems with loss of bowel control? N  Managing your Medications? N  Managing your Finances? N  Housekeeping or managing your Housekeeping? N  Some recent data might be hidden    Patient Care Team: Colon Branch, MD as PCP - General (Internal Medicine)   Assessment:   This is a routine wellness examination for Blake Wilcox.  Exercise Activities and Dietary recommendations    Goals    None      Fall Risk Fall Risk  06/18/2016 07/04/2015 01/17/2015  Falls in the past year? No No No   Is the patient's home free of loose throw rugs in walkways, pet beds, electrical cords, etc?   {Blank single:19197::"yes","no"}      Grab bars in the bathroom? {Blank single:19197::"yes","no"}      Handrails on the stairs?   {Blank single:19197::"yes","no"}      Adequate lighting?   {Blank single:19197::"yes","no"}  Timed Get Up and Go Performed: ***  Depression Screen PHQ 2/9 Scores 06/18/2016 07/04/2015 01/17/2015  PHQ - 2 Score 0 0 0    Cognitive Function MMSE - Mini Mental State Exam 04/01/2017 06/18/2016 06/18/2016 01/16/2016 07/18/2015  Not completed: - (No Data) Unable to complete - -  Orientation to time 0 - - 1 0  Orientation to Place 1 - - 3 2    Registration 3 - - 3 3  Attention/ Calculation 0 - - 0 2  Recall 0 - - 0 0  Language- name 2 objects 2 - - 2 2  Language- repeat 1 - - 1 0  Language- follow 3 step command 2 - - 3 3  Language- read & follow direction 0 - - 1 1  Write a sentence 0 - - 1 1  Copy design 0 - - 0 1  Total score 9 - - 15 15        Immunization History  Administered Date(s) Administered  . Influenza Split 11/18/2010  . Influenza Whole 01/11/2012  . Influenza, High Dose Seasonal PF 01/17/2015, 12/05/2015  . Influenza-Unspecified 11/10/2012, 11/26/2016  . Pneumococcal Conjugate-13 01/31/2015  . Pneumococcal Polysaccharide-23 07/04/2015  . Td 02/10/1997, 11/17/2007  . Zoster 04/04/2015    Qualifies for Shingles Vaccine? ***  Screening Tests Health Maintenance  Topic Date Due  . FOOT EXAM  06/18/2017  . URINE MICROALBUMIN  06/18/2017  . OPHTHALMOLOGY EXAM  08/04/2017  . INFLUENZA VACCINE  09/10/2017  . TETANUS/TDAP  11/16/2017  . HEMOGLOBIN A1C  11/26/2017  . PNA vac Low Risk Adult  Completed   Cancer Screenings: Lung: Low Dose CT Chest recommended if Age 72-80 years, 30 pack-year currently smoking OR have quit w/in 15years. Patient {DOES NOT does:27190::"does not"} qualify. Colorectal: ***  Additional Screenings: *** Hepatitis C Screening:      Plan:   ***  I have personally reviewed and noted the following in the patient's chart:   . Medical and social history . Use of alcohol, tobacco or illicit drugs  . Current medications and supplements . Functional ability and status . Nutritional status . Physical activity . Advanced directives . List of other physicians . Hospitalizations, surgeries, and ER visits in previous 12 months . Vitals . Screenings to include cognitive, depression, and falls . Referrals and appointments  In addition, I have reviewed and discussed with patient certain preventive protocols, quality metrics, and best practice recommendations. A written  personalized care plan for preventive services as well as general preventive health recommendations were provided to patient.     Shela Nevin, [place]  06/17/2017

## 2017-06-19 ENCOUNTER — Ambulatory Visit: Payer: Medicare Other | Admitting: *Deleted

## 2017-07-19 ENCOUNTER — Other Ambulatory Visit: Payer: Self-pay | Admitting: Internal Medicine

## 2017-08-31 ENCOUNTER — Encounter: Payer: Self-pay | Admitting: Internal Medicine

## 2017-09-01 ENCOUNTER — Other Ambulatory Visit: Payer: Self-pay | Admitting: Internal Medicine

## 2017-09-01 MED ORDER — INSULIN DETEMIR 100 UNIT/ML FLEXPEN: 35.0000 [IU] | PEN_INJECTOR | Freq: Every day | SUBCUTANEOUS | 0 refills | Status: DC

## 2017-09-30 ENCOUNTER — Encounter: Payer: Self-pay | Admitting: Internal Medicine

## 2017-09-30 ENCOUNTER — Ambulatory Visit: Payer: Medicare Other | Admitting: Internal Medicine

## 2017-09-30 VITALS — BP 126/84 | HR 86 | Temp 98.3°F | Resp 16 | Ht 72.0 in | Wt 186.4 lb

## 2017-09-30 DIAGNOSIS — E1169 Type 2 diabetes mellitus with other specified complication: Secondary | ICD-10-CM | POA: Diagnosis not present

## 2017-09-30 DIAGNOSIS — N401 Enlarged prostate with lower urinary tract symptoms: Secondary | ICD-10-CM

## 2017-09-30 DIAGNOSIS — F0280 Dementia in other diseases classified elsewhere without behavioral disturbance: Secondary | ICD-10-CM | POA: Diagnosis not present

## 2017-09-30 DIAGNOSIS — G301 Alzheimer's disease with late onset: Secondary | ICD-10-CM | POA: Diagnosis not present

## 2017-09-30 DIAGNOSIS — Z794 Long term (current) use of insulin: Secondary | ICD-10-CM

## 2017-09-30 DIAGNOSIS — Z23 Encounter for immunization: Secondary | ICD-10-CM

## 2017-09-30 LAB — CBC WITH DIFFERENTIAL/PLATELET
Basophils Absolute: 0 10*3/uL (ref 0.0–0.1)
Basophils Relative: 0.8 % (ref 0.0–3.0)
EOS PCT: 2.4 % (ref 0.0–5.0)
Eosinophils Absolute: 0.1 10*3/uL (ref 0.0–0.7)
HEMATOCRIT: 33.4 % — AB (ref 39.0–52.0)
HEMOGLOBIN: 10.8 g/dL — AB (ref 13.0–17.0)
LYMPHS PCT: 38.6 % (ref 12.0–46.0)
Lymphs Abs: 1.9 10*3/uL (ref 0.7–4.0)
MCHC: 32.4 g/dL (ref 30.0–36.0)
MCV: 80.2 fl (ref 78.0–100.0)
MONOS PCT: 8.7 % (ref 3.0–12.0)
Monocytes Absolute: 0.4 10*3/uL (ref 0.1–1.0)
Neutro Abs: 2.4 10*3/uL (ref 1.4–7.7)
Neutrophils Relative %: 49.5 % (ref 43.0–77.0)
Platelets: 300 10*3/uL (ref 150.0–400.0)
RBC: 4.17 Mil/uL — ABNORMAL LOW (ref 4.22–5.81)
RDW: 16.1 % — ABNORMAL HIGH (ref 11.5–15.5)
WBC: 4.9 10*3/uL (ref 4.0–10.5)

## 2017-09-30 LAB — AST: AST: 17 U/L (ref 0–37)

## 2017-09-30 LAB — HEMOGLOBIN A1C: Hgb A1c MFr Bld: 7.4 % — ABNORMAL HIGH (ref 4.6–6.5)

## 2017-09-30 LAB — TSH: TSH: 2.81 u[IU]/mL (ref 0.35–4.50)

## 2017-09-30 LAB — MICROALBUMIN / CREATININE URINE RATIO
Creatinine,U: 164.2 mg/dL
MICROALB UR: 39.1 mg/dL — AB (ref 0.0–1.9)
Microalb Creat Ratio: 23.8 mg/g (ref 0.0–30.0)

## 2017-09-30 LAB — ALT: ALT: 25 U/L (ref 0–53)

## 2017-09-30 MED ORDER — INSULIN DETEMIR 100 UNIT/ML FLEXPEN: 35.0000 [IU] | PEN_INJECTOR | Freq: Every day | SUBCUTANEOUS | 4 refills | Status: AC

## 2017-09-30 NOTE — Progress Notes (Signed)
Pre visit review using our clinic review tool, if applicable. No additional management support is needed unless otherwise documented below in the visit note. 

## 2017-09-30 NOTE — Assessment & Plan Note (Signed)
  DM: Currently on metformin, Levemir 35 units daily.  CBGs in the morning fasting range from 120-180.  Lowest CBG was 98. We will continue with same medicines, check a AST, ALT, A1c and micro. Hyperlipidemia: Diet controlled, labs on  RTC  BPH: Recurrent gross hematuria, last episode was treated over the phone by urology with antibiotics.  Currently asymptomatic. Dementia: Follow-up by neurology, on Aricept and Namenda.  Stable although he looks sleepy today.  For completeness we will check a CBC and a TSH. Preventive care: Td today RTC 6 months, fasting

## 2017-09-30 NOTE — Patient Instructions (Signed)
GO TO THE LAB : Get the blood work     GO TO THE FRONT DESK Schedule your next appointment for a   checkup in 6 months, fasting 

## 2017-09-30 NOTE — Progress Notes (Signed)
Subjective:    Patient ID: Blake Wilcox, male    DOB: 1940-02-28, 77 y.o.   MRN: [id number]  DOS:  09/30/2017 Type of visit - description : Routine follow-up Interval history: Here with his son and wife. No major concerns. Ambulatory CBGs reviewed. He did have another episode of gross hematuria, urology called antibiotics, symptoms resolved  Wt Readings from Last 3 Encounters:  09/30/17 186 lb 6 oz (84.5 kg)  05/27/17 190 lb 6.4 oz (86.4 kg)  04/01/17 194 lb (88 kg)     Review of Systems Dementia about the same, appetite is great. He is sleepy on and off, not a new symptom.   Past Medical History:  Diagnosis Date  . Anemia   . Diabetes mellitus    TYPE 2  . Elevated PSA   . FH: colonic polyps   . Hyperlipidemia   . Memory loss   . Vegetarian     Past Surgical History:  Procedure Laterality Date  . CATARACT EXTRACTION    . COLONOSCOPY W/ POLYPECTOMY    . FOREIGN BODY OS    . HEMORRHOID SURGERY    . TRANSURETHRAL RESECTION OF PROSTATE N/A 09/13/2014   did not find cancer    Social History   Socioeconomic History  . Marital status: Married    Spouse name: Blake Wilcox  . Number of children: 1  . Years of education: 43  . Highest education level: Not on file  Occupational History  . Occupation: retired    Fish farm manager: [employer]  Social Needs  . Financial resource strain: Not on file  . Food insecurity:    Worry: Not on file    Inability: Not on file  . Transportation needs:    Medical: Not on file    Non-medical: Not on file  Tobacco Use  . Smoking status: Never Smoker  . Smokeless tobacco: Never Used  Substance and Sexual Activity  . Alcohol use: No    Alcohol/week: 0.0 standard drinks  . Drug use: No  . Sexual activity: Not Currently  Lifestyle  . Physical activity:    Days per week: Not on file    Minutes per session: Not on file  . Stress: Not on file  Relationships  . Social connections:    Talks on phone: Not on file   Gets together: Not on file    Attends religious service: Not on file    Active member of club or organization: Not on file    Attends meetings of clubs or organizations: Not on file    Relationship status: Not on file  . Intimate partner violence:    Fear of current or ex partner: Not on file    Emotionally abused: Not on file    Physically abused: Not on file    Forced sexual activity: Not on file  Other Topics Concern  . 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        Right handed.   Caffeine - one cup daily.   Patient has one child.      Allergies as of 09/30/2017   No Known Allergies     Medication List        Accurate as of 09/30/17  9:27 PM. Always use your most  recent med list.          aspirin 81 MG tablet Take 81 mg by mouth daily.   donepezil 10 MG tablet Commonly known as:  ARICEPT Take 1 tablet (10 mg total) by mouth at bedtime.   glucose blood test strip TEST BLOOD SUGAR TWICE DAILY   Insulin Detemir 100 UNIT/ML Pen Commonly known as:  LEVEMIR Inject 35 Units into the skin daily at 10 pm.   Insulin Pen Needle 31G X 8 MM Misc To use w/ Levemir daily   memantine 10 MG tablet Commonly known as:  NAMENDA Take 1 tablet (10 mg total) by mouth 2 (two) times daily.   metFORMIN 500 MG tablet Commonly known as:  GLUCOPHAGE Take 2 tablets every morning and 1 tablet every evening   multivitamin tablet Take 1 tablet by mouth daily.   ONE TOUCH ULTRA 2 w/Device Kit Use to check blood sugars twice a day Dx K08.8   ONETOUCH DELICA LANCETS FINE Misc Use to help check blood sugars twice a day          Objective:   Physical Exam BP 126/84 (BP Location: Left Arm, Patient Position: Sitting, Cuff Size: Small)   Pulse 86   Temp 98.3 F (36.8 C) (Oral)    Resp 16   Ht 6' (1.829 m)   Wt 186 lb 6 oz (84.5 kg)   SpO2 96%   BMI 25.28 kg/m  General:   Well developed, NAD, see BMI.  HEENT:  Normocephalic . Face symmetric, atraumatic Lungs:  CTA B Normal respiratory effort, no intercostal retractions, no accessory muscle use. Heart: RRR,  no murmur.  No pretibial edema bilaterally  Skin: Not pale. Not jaundice Neurologic:  Alert but doze off   throughout the visit Does not follow simple commands.  Gait appropriate for age and unassisted Psych--  Pleasantly demented.    Assessment & Plan:   Assessment DM Hyperlipidemia BPH TURP 2016 Dr Hazle Nordmann Progressive dementia, f/u [facility],neurology   son-- Blake Wilcox   PLAN DM: Currently on metformin, Levemir 35 units daily.  CBGs in the morning fasting range from 120-180.  Lowest CBG was 98. We will continue with same medicines, check a AST, ALT, A1c and micro. Hyperlipidemia: Diet controlled, labs on  RTC  BPH: Recurrent gross hematuria, last episode was treated over the phone by urology with antibiotics.  Currently asymptomatic. Dementia: Follow-up by neurology, on Aricept and Namenda.  Stable although he looks sleepy today.  For completeness we will check a CBC and a TSH. Preventive care: Td today RTC 6 months, fasting

## 2017-10-02 ENCOUNTER — Other Ambulatory Visit: Payer: Self-pay

## 2017-10-02 DIAGNOSIS — D649 Anemia, unspecified: Secondary | ICD-10-CM

## 2017-10-02 NOTE — Progress Notes (Signed)
ferri

## 2017-10-11 ENCOUNTER — Other Ambulatory Visit: Payer: Self-pay | Admitting: Internal Medicine

## 2017-10-14 ENCOUNTER — Encounter: Payer: Self-pay | Admitting: Internal Medicine

## 2017-10-14 MED ORDER — INSULIN PEN NEEDLE 31G X 8 MM MISC: 12 refills | Status: DC

## 2017-11-16 ENCOUNTER — Other Ambulatory Visit: Payer: Self-pay | Admitting: Internal Medicine

## 2018-01-21 ENCOUNTER — Encounter: Payer: Self-pay | Admitting: Internal Medicine

## 2018-01-22 MED ORDER — ONETOUCH DELICA LANCETS FINE MISC: 12 refills | Status: DC

## 2018-01-22 MED ORDER — METFORMIN HCL 500 MG PO TABS: 500.0000 mg | ORAL_TABLET | Freq: Two times a day (BID) | ORAL | 1 refills | Status: DC

## 2018-03-02 ENCOUNTER — Encounter: Payer: Self-pay | Admitting: Internal Medicine

## 2018-04-01 ENCOUNTER — Ambulatory Visit: Payer: Medicare Other | Admitting: Nurse Practitioner

## 2018-04-07 ENCOUNTER — Encounter: Payer: Self-pay | Admitting: Internal Medicine

## 2018-04-07 ENCOUNTER — Ambulatory Visit: Payer: Medicare Other | Admitting: Internal Medicine

## 2018-04-07 VITALS — BP 128/84 | HR 70 | Temp 97.4°F | Resp 16 | Ht 72.0 in | Wt 189.2 lb

## 2018-04-07 DIAGNOSIS — D649 Anemia, unspecified: Secondary | ICD-10-CM | POA: Diagnosis not present

## 2018-04-07 DIAGNOSIS — E1169 Type 2 diabetes mellitus with other specified complication: Secondary | ICD-10-CM | POA: Diagnosis not present

## 2018-04-07 DIAGNOSIS — Z794 Long term (current) use of insulin: Secondary | ICD-10-CM | POA: Diagnosis not present

## 2018-04-07 DIAGNOSIS — G301 Alzheimer's disease with late onset: Secondary | ICD-10-CM | POA: Diagnosis not present

## 2018-04-07 DIAGNOSIS — F0280 Dementia in other diseases classified elsewhere without behavioral disturbance: Secondary | ICD-10-CM

## 2018-04-07 LAB — CBC WITH DIFFERENTIAL/PLATELET
Basophils Absolute: 0 10*3/uL (ref 0.0–0.1)
Basophils Relative: 0.6 % (ref 0.0–3.0)
Eosinophils Absolute: 0 10*3/uL (ref 0.0–0.7)
Eosinophils Relative: 1.1 % (ref 0.0–5.0)
HCT: 33.7 % — ABNORMAL LOW (ref 39.0–52.0)
HEMOGLOBIN: 10.9 g/dL — AB (ref 13.0–17.0)
LYMPHS PCT: 26.8 % (ref 12.0–46.0)
Lymphs Abs: 1.1 10*3/uL (ref 0.7–4.0)
MCHC: 32.4 g/dL (ref 30.0–36.0)
MCV: 81.1 fl (ref 78.0–100.0)
Monocytes Absolute: 0.3 10*3/uL (ref 0.1–1.0)
Monocytes Relative: 8.6 % (ref 3.0–12.0)
Neutro Abs: 2.5 10*3/uL (ref 1.4–7.7)
Neutrophils Relative %: 62.9 % (ref 43.0–77.0)
Platelets: 323 10*3/uL (ref 150.0–400.0)
RBC: 4.16 Mil/uL — AB (ref 4.22–5.81)
RDW: 16.4 % — ABNORMAL HIGH (ref 11.5–15.5)
WBC: 4 10*3/uL (ref 4.0–10.5)

## 2018-04-07 LAB — COMPREHENSIVE METABOLIC PANEL
ALBUMIN: 4.3 g/dL (ref 3.5–5.2)
ALT: 14 U/L (ref 0–53)
AST: 13 U/L (ref 0–37)
Alkaline Phosphatase: 80 U/L (ref 39–117)
BUN: 14 mg/dL (ref 6–23)
CO2: 27 mEq/L (ref 19–32)
Calcium: 10.2 mg/dL (ref 8.4–10.5)
Chloride: 99 mEq/L (ref 96–112)
Creatinine, Ser: 0.9 mg/dL (ref 0.40–1.50)
GFR: 99.49 mL/min (ref >60.00)
Glucose, Bld: 295 mg/dL — ABNORMAL HIGH (ref 70–99)
Potassium: 4.6 mEq/L (ref 3.5–5.1)
Sodium: 138 mEq/L (ref 135–145)
Total Bilirubin: 0.3 mg/dL (ref 0.2–1.2)
Total Protein: 7.6 g/dL (ref 6.0–8.3)

## 2018-04-07 LAB — IRON: Iron: 36 ug/dL — ABNORMAL LOW (ref 42–165)

## 2018-04-07 LAB — HEMOGLOBIN A1C: Hgb A1c MFr Bld: 7.8 % — ABNORMAL HIGH (ref 4.6–6.5)

## 2018-04-07 MED ORDER — ZOSTER VAC RECOMB ADJUVANTED 50 MCG/0.5ML IM SUSR: 0.5000 mL | Freq: Once | INTRAMUSCULAR | 1 refills | Status: AC

## 2018-04-07 MED ORDER — METFORMIN HCL 500 MG PO TABS: ORAL_TABLET | ORAL | 1 refills | Status: DC

## 2018-04-07 NOTE — Progress Notes (Signed)
Subjective:    Patient ID: Blake Wilcox, male    DOB: [birth year], 78 y.o.   MRN: [id number]  DOS:  04/07/2018 Type of visit - description: rov DM: Good compliance with medication, CBGs below 200 Dementia: Good compliance with medication, memory is gradually getting worse History of anemia: Due for recheck.  Review of Systems Denies nausea, vomiting, diarrhea.  No blood in the stools. No recent gross hematuria No behavioral issues, if anything he is hypo-active and does not like to do much.  Past Medical History:  Diagnosis Date  . Anemia   . Diabetes mellitus    TYPE 2  . Elevated PSA   . FH: colonic polyps   . Hyperlipidemia   . Memory loss   . Vegetarian     Past Surgical History:  Procedure Laterality Date  . CATARACT EXTRACTION    . COLONOSCOPY W/ POLYPECTOMY    . FOREIGN BODY OS    . HEMORRHOID SURGERY    . TRANSURETHRAL RESECTION OF PROSTATE N/A 09/13/2014   did not find cancer    Social History   Socioeconomic History  . Marital status: Married    Spouse name: Blake Wilcox  . Number of children: 1  . Years of education: 60  . Highest education level: Not on file  Occupational History  . Occupation: retired    Fish farm manager: [employer]  Social Needs  . Financial resource strain: Not on file  . Food insecurity:    Worry: Not on file    Inability: Not on file  . Transportation needs:    Medical: Not on file    Non-medical: Not on file  Tobacco Use  . Smoking status: Never Smoker  . Smokeless tobacco: Never Used  Substance and Sexual Activity  . Alcohol use: No    Alcohol/week: 0.0 standard drinks  . Drug use: No  . Sexual activity: Not Currently  Lifestyle  . Physical activity:    Days per week: Not on file    Minutes per session: Not on file  . Stress: Not on file  Relationships  . Social connections:    Talks on phone: Not on file    Gets together: Not on file    Attends religious service: Not on file    Active member of club or  organization: Not on file    Attends meetings of clubs or organizations: Not on file    Relationship status: Not on file  . Intimate partner violence:    Fear of current or ex partner: Not on file    Emotionally abused: Not on file    Physically abused: Not on file    Forced sexual activity: Not on file  Other Topics Concern  . 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        Right handed.   Caffeine - one cup daily.   Patient has one child.      Allergies as of 04/07/2018   No Known Allergies     Medication List       Accurate as of April 07, 2018 10:36 AM. Always use your most recent med list.        aspirin 81 MG tablet Take 81 mg by mouth daily.   donepezil 10  MG tablet Commonly known as:  ARICEPT Take 1 tablet (10 mg total) by mouth at bedtime.   glucose blood test strip Commonly known as:  ONE TOUCH ULTRA TEST TEST BLOOD SUGAR TWICE DAILY   Insulin Detemir 100 UNIT/ML Pen Commonly known as:  LEVEMIR FLEXTOUCH Inject 35 Units into the skin daily at 10 pm.   Insulin Pen Needle 31G X 8 MM Misc Commonly known as:  B-D ULTRAFINE III SHORT PEN To use w/ Levemir daily   memantine 10 MG tablet Commonly known as:  NAMENDA Take 1 tablet (10 mg total) by mouth 2 (two) times daily.   metFORMIN 500 MG tablet Commonly known as:  GLUCOPHAGE Take 1 tablet (500 mg total) by mouth 2 (two) times daily with a meal.   multivitamin tablet Take 1 tablet by mouth daily.   ONE TOUCH ULTRA 2 w/Device Kit Use to check blood sugars twice a day Dx N02.7   ONETOUCH DELICA LANCETS FINE Misc Use to help check blood sugars twice a day           Objective:   Physical Exam BP 128/84 (BP Location: Right Arm, Patient Position: Sitting, Cuff Size: Normal)   Pulse (!) 42    Temp (!) 97.4 F (36.3 C) (Axillary)   Resp 16   Ht 6' (1.829 m)   Wt 189 lb 4 oz (85.8 kg)   SpO2 94%   BMI 25.67 kg/m  General:   Well developed, NAD, in a wheelchair. HEENT:  Normocephalic . Face symmetric, atraumatic Lungs:  CTA B Normal respiratory effort, no intercostal retractions, no accessory muscle use. Heart: RRR,  no murmur.  Diabetic foot exam: No edema, good pulses, unable to check for pinprick response Abdomen:  Not distended, soft, non-tender. No rebound or rigidity.   Skin: Not pale. Not jaundice Neurologic:  Sleepy buot arousable.  Demented.  Pleasant but does not follow commands.  Gait not tested Psych--  No anxious or depressed appearing.     Assessment     Assessment DM Hyperlipidemia BPH TURP 2016 Dr Hazle Nordmann Progressive dementia, f/u [facility],neurology   son-- Blake Wilcox   PLAN DM: Currently on Levemir 35 units and metformin 500 mg 2 tablets in the morning and 1 in the afternoon, CBGs range from 100-200.We will check a A1c, CMP. Hyperlipidemia: Diet controlled, check FLP Dementia: Good compliance with Aricept, Namenda.  Memory is getting worse according to the family.  No behavioral issues however he is quite hypoactive.  Will see neurology soon. Anemia: GI ROS limited by patient's dementia but per family is negative, will check CBC, iron panel and ferritin. Preventive care: [employer] printed benefits discussed. Social: Lives at home with wife his son, able to walk but hardly ever does, has  difficulty transferring. We will provide physical therapy to prevent falls and  getting weaker.  He might qualify also for some help. RTC 4 months.

## 2018-04-07 NOTE — Progress Notes (Signed)
Pre visit review using our clinic review tool, if applicable. No additional management support is needed unless otherwise documented below in the visit note. 

## 2018-04-07 NOTE — Patient Instructions (Addendum)
Please schedule a Medicare Wellness with [employer].    GO TO THE LAB : Get the blood work     GO TO THE FRONT DESK Schedule your next appointment   for checkup in 4 months

## 2018-04-07 NOTE — Assessment & Plan Note (Signed)
DM: Currently on Levemir 35 units and metformin 500 mg 2 tablets in the morning and 1 in the afternoon, CBGs range from 100-200.We will check a A1c, CMP. Hyperlipidemia: Diet controlled, check FLP Dementia: Good compliance with Aricept, Namenda.  Memory is getting worse according to the family.  No behavioral issues however he is quite hypoactive.  Will see neurology soon. Anemia: GI ROS limited by patient's dementia but per family is negative, will check CBC, iron panel and ferritin. Preventive care: [employer] printed benefits discussed. Social: Lives at home with wife his son, able to walk but hardly ever does, has  difficulty transferring. We will provide physical therapy to prevent falls and  getting weaker.  He might qualify also for some help. RTC 4 months.

## 2018-04-08 LAB — FERRITIN: Ferritin: 8.7 ng/mL — ABNORMAL LOW (ref 22.0–322.0)

## 2018-04-11 ENCOUNTER — Other Ambulatory Visit: Payer: Self-pay | Admitting: Neurology

## 2018-04-12 ENCOUNTER — Telehealth: Payer: Self-pay

## 2018-04-12 NOTE — Telephone Encounter (Signed)
Copied from CRM [id number]. Topic: General - Other >> Apr 12, 2018  4:48 PM Trula Slade wrote: Reason for CRM:   Patient is calling for recent lab results

## 2018-04-12 NOTE — Telephone Encounter (Signed)
Son returning your call °

## 2018-04-12 NOTE — Telephone Encounter (Signed)
Spoke with Caryn Bee, see labs.

## 2018-04-13 MED ORDER — FERROUS SULFATE 325 (65 FE) MG PO TABS: 325.0000 mg | ORAL_TABLET | Freq: Two times a day (BID) | ORAL | 6 refills | Status: DC

## 2018-04-13 NOTE — Addendum Note (Signed)
Addended byConrad Baneberry D on: 04/13/2018 07:48 AM   Modules accepted: Orders

## 2018-04-14 ENCOUNTER — Ambulatory Visit: Payer: Medicare Other | Admitting: Neurology

## 2018-04-14 ENCOUNTER — Encounter: Payer: Self-pay | Admitting: Neurology

## 2018-04-14 VITALS — BP 137/81 | HR 91 | Ht 72.0 in | Wt 189.0 lb

## 2018-04-14 DIAGNOSIS — F0280 Dementia in other diseases classified elsewhere without behavioral disturbance: Secondary | ICD-10-CM

## 2018-04-14 DIAGNOSIS — G301 Alzheimer's disease with late onset: Secondary | ICD-10-CM

## 2018-04-14 NOTE — Patient Instructions (Signed)
You can stop taking Namenda and Aricpet.

## 2018-04-14 NOTE — Progress Notes (Signed)
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  In December 2013, he had excessive polyuria, he urinate every 15 minutes, went to Dr. Linna Darner, was found to have elevated glucose, A1c 15.1, he was started on diabetes medications, but since that time, he was also noticed to have mild short-term memory trouble, he could not remember where he puts things, but he is still able to drive, and go to work, he denies visual loss, no lateralized motor or sensory deficit CT head was normal. Normal TSH, B12.   UPDATE June 22nd 2016: Last clinical visit was in December 2015 with [name], he is now taking Aricept 10 mg daily, which seems to help his memory. He also use insulin, his son [name] inject him every night He lives with his family, he still drives short distance, watches TV, MMSE 18 out of 85 today   UPDATE Dec 6th 2017: He is with his son and daughter, he lives with his wife and his son, he stays home, cut the yard, rake up leaves. He has good appetite, sleeps a lot. No agitations. He is taking Aricept 10 mg daily, Namenda 10 mg twice a day  UPDATE Apr 01 2017: Patient continued to decline, worsening memory loss, mobility, at the end of 2018 also had worsening bowel and bladder incontinence, wears diapers now, he lives with his wife and son, sleeps most of the time, mild agitation,  A1c in January 2019 9.3, creatinine 0.9  Update April 14, 2018 SS: His last MMSE in February 2019 was 9/30.  He is currently taking Namenda 10 mg twice a day, Aricept 10 mg daily. HGB 10.21 March 2018 and started on a daily iron tablet.  He  currently lives with his family and is almost a total assist.  Occasionally he will feed himself but he spends most of his time sitting watching TV.  He does have minimal verbal response and sometimes he will make sense.  She is able to call his wife's name.  He has a good appetite.  He does wear adult incontinence briefs.  When the weather is nice he will walk in his neighborhood with his son and his wife does take him to church every week.  He refused to go to [facility] because he prefers to be in his own surroundings.  At today's appointment his son is requesting to take him off the Aricept and Namenda because he has not seen any benefit and Blake Wilcox does not like to take pills.  Apparently he will keep his pills in his mouth and not swallowing but he will toss them across the room.   REVIEW OF SYSTEMS: Out of a complete 14 system review of symptoms, the patient complains only of the following symptoms, and all other reviewed systems are negative.  Leg swelling, incontinence of bladder, walking difficulty, anemia, memory loss  ALLERGIES: No Known Allergies  HOME MEDICATIONS: Outpatient Medications Prior to Visit  Medication Sig Dispense Refill  . aspirin 81 MG tablet Take 81 mg by mouth daily.    . Blood Glucose Monitoring Suppl (ONE TOUCH ULTRA 2) W/DEVICE KIT  Use to check blood sugars twice a day Dx E11.9 1 each 0  . donepezil (ARICEPT) 10 MG tablet Take 1 tablet (10 mg total) by mouth at bedtime. 90 tablet 4  . ferrous sulfate 325 (65 FE) MG tablet Take 1 tablet (325 mg total) by mouth 2 (two) times daily with a meal. 60 tablet 6  . glucose blood (ONE TOUCH ULTRA TEST) test strip TEST BLOOD SUGAR TWICE DAILY 200 each 99  . Insulin Detemir (LEVEMIR FLEXTOUCH) 100 UNIT/ML Pen Inject 35 Units into the skin daily at 10 pm. 45 mL 4  . Insulin Pen Needle (B-D ULTRAFINE III SHORT PEN) 31G X 8 MM MISC To use w/ Levemir daily 100 each 12  . memantine (NAMENDA) 10 MG tablet Take 1  tablet (10 mg total) by mouth 2 (two) times daily. 180 tablet 4  . metFORMIN (GLUCOPHAGE) 500 MG tablet Take 2 tablets every morning and 1 tablet every evening 270 tablet 1  . Multiple Vitamin (MULTIVITAMIN) tablet Take 1 tablet by mouth daily.    Glory Rosebush DELICA LANCETS FINE MISC Use to help check blood sugars twice a day 100 each 12   No facility-administered medications prior to visit.     PAST MEDICAL HISTORY: Past Medical History:  Diagnosis Date  . Anemia   . Anemia   . Diabetes mellitus    TYPE 2  . Elevated PSA   . FH: colonic polyps   . Hyperlipidemia   . Memory loss   . Vegetarian     PAST SURGICAL HISTORY: Past Surgical History:  Procedure Laterality Date  . CATARACT EXTRACTION    . COLONOSCOPY W/ POLYPECTOMY    . FOREIGN BODY OS    . HEMORRHOID SURGERY    . TRANSURETHRAL RESECTION OF PROSTATE N/A 09/13/2014   did not find cancer    FAMILY HISTORY: Family History  Problem Relation Age of Onset  . Cancer Sister        BREAST  . Diabetes Maternal Aunt   . Stroke Neg Hx   . Hearing loss Neg Hx   . Colon cancer Neg Hx   . Prostate cancer Neg Hx     SOCIAL HISTORY: Social History   Socioeconomic History  . Marital status: Married    Spouse name: Delrose  . Number of children: 1  . Years of education: 18  . Highest education level: Not on file  Occupational History  . Occupation: retired    Fish farm manager: [employer]  Social Needs  . Financial resource strain: Not on file  . Food insecurity:    Worry: Not on file    Inability: Not on file  . Transportation needs:    Medical: Not on file    Non-medical: Not on file  Tobacco Use  . Smoking status: Never Smoker  . Smokeless tobacco: Never Used  Substance and Sexual Activity  . Alcohol use: No    Alcohol/week: 0.0 standard drinks  . Drug use: No  . Sexual activity: Not Currently  Lifestyle  . Physical activity:    Days per week: Not on file    Minutes per session: Not on file  .  Stress: Not on file  Relationships  . Social connections:    Talks on phone: Not on file    Gets together: Not on file    Attends religious service: Not on file    Active member of club or organization: Not on file    Attends meetings of clubs  or organizations: Not on file    Relationship status: Not on file  . Intimate partner violence:    Fear of current or ex partner: Not on file    Emotionally abused: Not on file    Physically abused: Not on file    Forced sexual activity: Not on file  Other Topics Concern  . 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        Right handed.   Caffeine - one cup daily.   Patient has one child.      PHYSICAL EXAM  Vitals:   04/14/18 1038  BP: 137/81  Pulse: 91  Weight: 189 lb (85.7 kg)  Height: 6' (1.829 m)   Body mass index is 25.63 kg/m.  Generalized: Well developed, in no acute distress  Unable to perform the MMSE today Neurological examination  Mentation: Alert, very limited verbal response not able to follow commands for exam Cranial nerve II-XII: Pupils were equal round reactive to light. Extraocular movements were full, visual field were full on confrontational test.  Motor: Able to move all 4 extremities independently, able to squeeze examiner's hands on command Sensory: . No evidence of extinction is noted.  Coordination: Unable to perform Gait and station: Gait is very unsteady, wide-based, slow, with son's assist Reflexes: Deep tendon reflexes are symmetric and normal bilaterally.   DIAGNOSTIC DATA (LABS, IMAGING, TESTING) - I reviewed patient records, labs, notes, testing and imaging myself where available.  Lab Results  Component Value Date   WBC 4.0 04/07/2018   HGB 10.9 (L) 04/07/2018   HCT 33.7 (L) 04/07/2018    MCV 81.1 04/07/2018   PLT 323.0 04/07/2018      Component Value Date/Time   NA 138 04/07/2018 1056   K 4.6 04/07/2018 1056   CL 99 04/07/2018 1056   CO2 27 04/07/2018 1056   GLUCOSE 295 (H) 04/07/2018 1056   BUN 14 04/07/2018 1056   CREATININE 0.90 04/07/2018 1056   CALCIUM 10.2 04/07/2018 1056   PROT 7.6 04/07/2018 1056   ALBUMIN 4.3 04/07/2018 1056   AST 13 04/07/2018 1056   ALT 14 04/07/2018 1056   ALKPHOS 80 04/07/2018 1056   BILITOT 0.3 04/07/2018 1056   GFRNONAA 145.01 04/26/2009 0851   GFRAA 146 11/17/2007 0000   Lab Results  Component Value Date   CHOL 203 (H) 06/18/2016   HDL 67.40 06/18/2016   LDLCALC 109 (H) 06/18/2016   TRIG 130.0 06/18/2016   CHOLHDL 3 06/18/2016   Lab Results  Component Value Date   HGBA1C 7.8 (H) 04/07/2018   Lab Results  Component Value Date   [id number] 673 02/18/2012   Lab Results  Component Value Date   TSH 2.81 09/30/2017      ASSESSMENT AND PLAN 78 y.o. year old male  has a past medical history of Anemia, Anemia, Diabetes mellitus, Elevated PSA, FH: colonic polyps, Hyperlipidemia, Memory loss, and Vegetarian. here with:  1.  Progressive memory disorder, Dementia  Today he is unable to complete the MMSE.  His memory continues to decline.  We did discuss memory care units and skilled nursing facilities however the family is managing his care at home at this time.  His last score was 9/30-1 year ago.  He will stop taking Aricept and  Namenda.  This decision was made per family request as they have not seen any benefit and they have a hard time getting him to take pills.  At this point the pills are not beneficial.  I provided them with a dementia packet of community resources.  He maintains close follow-up with his primary care provider.  He will follow-up in this office on an as-needed basis.   I spent 15 minutes with the patient. 50% of this time was spent discussing his plan of care.    Butler Denmark, AGNP-C, DNP 04/14/2018,  11:14 AM [facility] [address], [place], [place] [address] [phone]

## 2018-04-14 NOTE — Addendum Note (Signed)
Addended by: Glean Salvo on: 04/14/2018 11:15 AM   Modules accepted: Orders

## 2018-04-27 NOTE — Progress Notes (Signed)
I have reviewed and agreed above plan. 

## 2018-07-29 ENCOUNTER — Other Ambulatory Visit: Payer: Self-pay

## 2018-07-29 ENCOUNTER — Inpatient Hospital Stay (HOSPITAL_BASED_OUTPATIENT_CLINIC_OR_DEPARTMENT_OTHER)
Admission: EM | Admit: 2018-07-29 | Discharge: 2018-09-11 | DRG: 871 | Disposition: E | Payer: Medicare Other | Attending: Internal Medicine | Admitting: Internal Medicine

## 2018-07-29 ENCOUNTER — Emergency Department (HOSPITAL_BASED_OUTPATIENT_CLINIC_OR_DEPARTMENT_OTHER): Payer: Medicare Other

## 2018-07-29 ENCOUNTER — Ambulatory Visit: Payer: Medicare Other | Admitting: Internal Medicine

## 2018-07-29 ENCOUNTER — Encounter: Payer: Self-pay | Admitting: Internal Medicine

## 2018-07-29 ENCOUNTER — Encounter (HOSPITAL_BASED_OUTPATIENT_CLINIC_OR_DEPARTMENT_OTHER): Payer: Self-pay | Admitting: Emergency Medicine

## 2018-07-29 VITALS — BP 129/84 | HR 52 | Temp 98.3°F | Resp 18 | Ht 72.0 in

## 2018-07-29 DIAGNOSIS — Z833 Family history of diabetes mellitus: Secondary | ICD-10-CM

## 2018-07-29 DIAGNOSIS — Z7401 Bed confinement status: Secondary | ICD-10-CM | POA: Diagnosis not present

## 2018-07-29 DIAGNOSIS — A411 Sepsis due to other specified staphylococcus: Principal | ICD-10-CM | POA: Diagnosis present

## 2018-07-29 DIAGNOSIS — R0902 Hypoxemia: Secondary | ICD-10-CM | POA: Diagnosis present

## 2018-07-29 DIAGNOSIS — E1165 Type 2 diabetes mellitus with hyperglycemia: Secondary | ICD-10-CM | POA: Diagnosis present

## 2018-07-29 DIAGNOSIS — N390 Urinary tract infection, site not specified: Secondary | ICD-10-CM | POA: Diagnosis present

## 2018-07-29 DIAGNOSIS — R32 Unspecified urinary incontinence: Secondary | ICD-10-CM | POA: Diagnosis present

## 2018-07-29 DIAGNOSIS — Z9079 Acquired absence of other genital organ(s): Secondary | ICD-10-CM

## 2018-07-29 DIAGNOSIS — Z794 Long term (current) use of insulin: Secondary | ICD-10-CM | POA: Diagnosis not present

## 2018-07-29 DIAGNOSIS — D649 Anemia, unspecified: Secondary | ICD-10-CM | POA: Diagnosis present

## 2018-07-29 DIAGNOSIS — E860 Dehydration: Secondary | ICD-10-CM | POA: Diagnosis present

## 2018-07-29 DIAGNOSIS — Z79899 Other long term (current) drug therapy: Secondary | ICD-10-CM | POA: Diagnosis not present

## 2018-07-29 DIAGNOSIS — E119 Type 2 diabetes mellitus without complications: Secondary | ICD-10-CM

## 2018-07-29 DIAGNOSIS — A4189 Other specified sepsis: Secondary | ICD-10-CM | POA: Diagnosis present

## 2018-07-29 DIAGNOSIS — D509 Iron deficiency anemia, unspecified: Secondary | ICD-10-CM | POA: Diagnosis present

## 2018-07-29 DIAGNOSIS — R6520 Severe sepsis without septic shock: Secondary | ICD-10-CM | POA: Diagnosis present

## 2018-07-29 DIAGNOSIS — D72810 Lymphocytopenia: Secondary | ICD-10-CM | POA: Diagnosis present

## 2018-07-29 DIAGNOSIS — Z6821 Body mass index (BMI) 21.0-21.9, adult: Secondary | ICD-10-CM | POA: Diagnosis not present

## 2018-07-29 DIAGNOSIS — U071 COVID-19: Secondary | ICD-10-CM | POA: Diagnosis present

## 2018-07-29 DIAGNOSIS — F0280 Dementia in other diseases classified elsewhere without behavioral disturbance: Secondary | ICD-10-CM | POA: Diagnosis present

## 2018-07-29 DIAGNOSIS — E43 Unspecified severe protein-calorie malnutrition: Secondary | ICD-10-CM | POA: Diagnosis present

## 2018-07-29 DIAGNOSIS — R509 Fever, unspecified: Secondary | ICD-10-CM | POA: Diagnosis present

## 2018-07-29 DIAGNOSIS — R531 Weakness: Secondary | ICD-10-CM

## 2018-07-29 DIAGNOSIS — Z7982 Long term (current) use of aspirin: Secondary | ICD-10-CM | POA: Diagnosis not present

## 2018-07-29 DIAGNOSIS — E1169 Type 2 diabetes mellitus with other specified complication: Secondary | ICD-10-CM | POA: Diagnosis not present

## 2018-07-29 DIAGNOSIS — Z66 Do not resuscitate: Secondary | ICD-10-CM | POA: Diagnosis present

## 2018-07-29 DIAGNOSIS — G309 Alzheimer's disease, unspecified: Secondary | ICD-10-CM | POA: Diagnosis present

## 2018-07-29 DIAGNOSIS — Z515 Encounter for palliative care: Secondary | ICD-10-CM | POA: Diagnosis present

## 2018-07-29 DIAGNOSIS — G9341 Metabolic encephalopathy: Secondary | ICD-10-CM | POA: Diagnosis present

## 2018-07-29 DIAGNOSIS — R4182 Altered mental status, unspecified: Secondary | ICD-10-CM | POA: Diagnosis not present

## 2018-07-29 DIAGNOSIS — F0390 Unspecified dementia without behavioral disturbance: Secondary | ICD-10-CM | POA: Diagnosis present

## 2018-07-29 LAB — CBC WITH DIFFERENTIAL/PLATELET
Abs Immature Granulocytes: 0 10*3/uL (ref 0.00–0.07)
Basophils Absolute: 0 10*3/uL (ref 0.0–0.1)
Basophils Relative: 0 %
Eosinophils Absolute: 0 10*3/uL (ref 0.0–0.5)
Eosinophils Relative: 0 %
HCT: 41.2 % (ref 39.0–52.0)
Hemoglobin: 13.4 g/dL (ref 13.0–17.0)
Immature Granulocytes: 0 %
Lymphocytes Relative: 19 %
Lymphs Abs: 0.7 10*3/uL (ref 0.7–4.0)
MCH: 28.8 pg (ref 26.0–34.0)
MCHC: 32.5 g/dL (ref 30.0–36.0)
MCV: 88.4 fL (ref 80.0–100.0)
Monocytes Absolute: 0.4 10*3/uL (ref 0.1–1.0)
Monocytes Relative: 11 %
Neutro Abs: 2.6 10*3/uL (ref 1.7–7.7)
Neutrophils Relative %: 70 %
Platelets: 243 10*3/uL (ref 150–400)
RBC: 4.66 MIL/uL (ref 4.22–5.81)
RDW: 14.6 % (ref 11.5–15.5)
WBC: 3.7 10*3/uL — ABNORMAL LOW (ref 4.0–10.5)
nRBC: 0 % (ref 0.0–0.2)

## 2018-07-29 LAB — COMPREHENSIVE METABOLIC PANEL
ALT: 54 U/L — ABNORMAL HIGH (ref 0–44)
AST: 42 U/L — ABNORMAL HIGH (ref 15–41)
Albumin: 3.6 g/dL (ref 3.5–5.0)
Alkaline Phosphatase: 76 U/L (ref 38–126)
Anion gap: 14 (ref 5–15)
BUN: 20 mg/dL (ref 8–23)
CO2: 22 mmol/L (ref 22–32)
Calcium: 9.4 mg/dL (ref 8.9–10.3)
Chloride: 98 mmol/L (ref 98–111)
Creatinine, Ser: 0.86 mg/dL (ref 0.61–1.24)
GFR calc Af Amer: >60 mL/min (ref >60)
GFR calc non Af Amer: >60 mL/min (ref >60)
Glucose, Bld: 249 mg/dL — ABNORMAL HIGH (ref 70–99)
Potassium: 4.2 mmol/L (ref 3.5–5.1)
Sodium: 134 mmol/L — ABNORMAL LOW (ref 135–145)
Total Bilirubin: 0.7 mg/dL (ref 0.3–1.2)
Total Protein: 7.8 g/dL (ref 6.5–8.1)

## 2018-07-29 LAB — URINALYSIS, MICROSCOPIC (REFLEX): RBC / HPF: >50 RBC/hpf (ref 0–5)

## 2018-07-29 LAB — URINALYSIS, ROUTINE W REFLEX MICROSCOPIC

## 2018-07-29 LAB — LACTIC ACID, PLASMA: Lactic Acid, Venous: 1.8 mmol/L (ref 0.5–1.9)

## 2018-07-29 LAB — CBG MONITORING, ED: Glucose-Capillary: 225 mg/dL — ABNORMAL HIGH (ref 70–99)

## 2018-07-29 LAB — TROPONIN I: Troponin I: <0.03 ng/mL (ref <0.03)

## 2018-07-29 LAB — SARS CORONAVIRUS 2 AG (30 MIN TAT): SARS Coronavirus 2 Ag: POSITIVE — AB

## 2018-07-29 MED ORDER — SODIUM CHLORIDE 0.9 % IV SOLN
Freq: Once | INTRAVENOUS | Status: AC
  Administered 2018-07-29: 12:00:00 via INTRAVENOUS

## 2018-07-29 MED ORDER — SODIUM CHLORIDE 0.9 % IV SOLN
1.0000 g | Freq: Once | INTRAVENOUS | Status: AC
  Administered 2018-07-29: 1 g via INTRAVENOUS
  Filled 2018-07-29: qty 10

## 2018-07-29 NOTE — Patient Instructions (Signed)
Please proceed to the ER 

## 2018-07-29 NOTE — ED Triage Notes (Addendum)
Pt sent from PCP office with his son who reports general decline in health over the past week. Denies cough, N/V/D. Pt is non verbal and has been  non ambulatory for 1 week. Pt winces as if in pain when touched.

## 2018-07-29 NOTE — Progress Notes (Signed)
Subjective:    Patient ID: Blake Wilcox, male    DOB: [birth year], 78 y.o.   MRN: [id number]  DOS:  07/21/2018 Type of visit - description: Acute visit, here with his son Blake Wilcox The patient has advanced dementia, note from neurology few months ago reviewed, he was gradually deteriorating. They are here because he got a lot worse on 07/25/2018: He  stopped eating, they are forcing fluids the best they can He was leaning initially to the right but now is leaning to the left. Become nonverbal and stop walking. No exposure to anybody with COVID-19-like symptoms.   Review of Systems They have not noticed any fever or chills, no lower extremity swelling.  No diarrhea. Ambulatory CBGs in the 120s, 170s. Patient unable to report pain however they noticed that he is uncomfortable when they move him around. They did notice gross hematuria today, that has been a on and off issue for him.  Past Medical History:  Diagnosis Date  . Anemia   . Anemia   . Diabetes mellitus    TYPE 2  . Elevated PSA   . FH: colonic polyps   . Hyperlipidemia   . Memory loss   . Vegetarian     Past Surgical History:  Procedure Laterality Date  . CATARACT EXTRACTION    . COLONOSCOPY W/ POLYPECTOMY    . FOREIGN BODY OS    . HEMORRHOID SURGERY    . TRANSURETHRAL RESECTION OF PROSTATE N/A 09/13/2014   did not find cancer    Social History   Socioeconomic History  . Marital status: Married    Spouse name: Delrose  . Number of children: 1  . Years of education: 77  . Highest education level: Not on file  Occupational History  . Occupation: retired    Fish farm manager: [employer]  Social Needs  . Financial resource strain: Not on file  . Food insecurity    Worry: Not on file    Inability: Not on file  . Transportation needs    Medical: Not on file    Non-medical: Not on file  Tobacco Use  . Smoking status: Never Smoker  . Smokeless tobacco: Never Used  Substance and Sexual Activity  .  Alcohol use: No    Alcohol/week: 0.0 standard drinks  . Drug use: No  . Sexual activity: Not Currently  Lifestyle  . Physical activity    Days per week: Not on file    Minutes per session: Not on file  . Stress: Not on file  Relationships  . Social Herbalist on phone: Not on file    Gets together: Not on file    Attends religious service: Not on file    Active member of club or organization: Not on file    Attends meetings of clubs or organizations: Not on file    Relationship status: Not on file  . Intimate partner violence    Fear of current or ex partner: Not on file    Emotionally abused: Not on file    Physically abused: Not on file    Forced sexual activity: Not on file  Other Topics Concern  . 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  Right handed.   Caffeine - one cup daily.   Patient has one child.      Allergies as of 07/27/2018   No Known Allergies     Medication List       Accurate as of July 29, 2018 10:52 AM. If you have any questions, ask your nurse or doctor.        aspirin 81 MG tablet Take 81 mg by mouth daily.   ferrous sulfate 325 (65 FE) MG tablet Take 1 tablet (325 mg total) by mouth 2 (two) times daily with a meal.   glucose blood test strip Commonly known as: ONE TOUCH ULTRA TEST TEST BLOOD SUGAR TWICE DAILY   Insulin Detemir 100 UNIT/ML Pen Commonly known as: Levemir FlexTouch Inject 35 Units into the skin daily at 10 pm.   Insulin Pen Needle 31G X 8 MM Misc Commonly known as: B-D ULTRAFINE III SHORT PEN To use w/ Levemir daily   metFORMIN 500 MG tablet Commonly known as: GLUCOPHAGE Take 2 tablets every morning and 1 tablet every evening   multivitamin tablet Take 1 tablet by mouth daily.   ONE TOUCH ULTRA 2  w/Device Kit Use to check blood sugars twice a day Dx V76.1   OneTouch Delica Lancets Fine Misc Use to help check blood sugars twice a day           Objective:   Physical Exam BP 129/84 (BP Location: Right Arm, Patient Position: Sitting, Cuff Size: Small)   Pulse (!) 52   Temp 98.3 F (36.8 C) (Oral)   Resp 18   Ht 6' (1.829 m)   SpO2 (!) 88%   BMI 25.63 kg/m  General:   Well developed, NAD, sits in a wheelchair..  O2 sat 88% HEENT:  Normocephalic  Lungs:  Decreased breath sounds Normal respiratory effort, no intercostal retractions, no accessory muscle use. Heart: RRR,  no murmur.  Skin: Not pale. Not jaundice Neurologic:  Sleepy but arousable, nonverbal, does not follow commands. Sitting in a wheelchair, leaning to the left. Motor: Moves left arm without problems, minimal movement of the right arm and it is certainly weaker.  Lower extremities are symmetrically weak. Gait not attempted. Neck is tilted to the left, somewhat stiff, the patient expressed pain when I tried to move his head. Psych--consistent with dementia    Assessment       Assessment DM Hyperlipidemia BPH TURP 2016 Dr Hazle Nordmann Progressive dementia, f/u [facility],neurology   son-- [name]   PLAN Acute deterioration of dementia and mental status: The patient has advanced dementia, saw neurology 04/14/2018, they discontinue Namenda and Aricept they started a conversation about the memory care unit. The patient has an acute deterioration for the last 4 days, currently not eating, not communicating or walking. DDX is large but includes pneumonia, UTI, COVID-19, recent stroke, uncontrolled DM versus others. He did report recent hematuria but that has been on and off issue. I spoke with Blake Wilcox, his son, we agreed to send him to the emergency room for further evaluation. After the evaluation, he might need to be admitted however depending on the findings, they are open to the idea of going home with palliative  care or hospice. Spoke with the ER doctor, I appreciate her help Addendum: Patient was admitted, he tested + for COVID-19

## 2018-07-29 NOTE — ED Notes (Signed)
IV attempted x 2 without success. I was able to obtain labs

## 2018-07-29 NOTE — ED Notes (Signed)
ED TO INPATIENT HANDOFF REPORT  ED Nurse Name and Phone #: Jenny Reichmann [phone]  S Name/Age/Gender Blake Wilcox 78 y.o. male Room/Bed: MH09/MH09  Code Status   Code Status: Not on file  Home/SNF/Other Home Patient oriented to: self Is this baseline? No   Triage Complete: Triage complete  Chief Complaint sob  Triage Note Pt sent from PCP office with his son who reports general decline in health over the past week. Denies cough, N/V/D. Pt is non verbal and has been  non ambulatory for 1 week. Pt winces as if in pain when touched.    Allergies No Known Allergies  Level of Care/Admitting Diagnosis ED Disposition    ED Disposition Condition [facility]: [place] [[id number]]  Level of Care: Med-Surg [16]  Covid Evaluation: Confirmed COVID Positive  Isolation Risk Level: High Risk/Airborne (Aerosolizing procedure, nebulizer, intubated/ventilation, CPAP/BiPAP)  Diagnosis: COVID-19 virus infection [[id number]]  Admitting Physician: Edwin Dada [[id number]]  Attending Physician: Edwin Dada [[id number]]  Estimated length of stay: past midnight tomorrow  Certification:: I certify this patient will need inpatient services for at least 2 midnights  PT Class (Do Not Modify): Inpatient [101]  PT Acc Code (Do Not Modify): Private [1]       B Medical/Surgery History Past Medical History:  Diagnosis Date  . Anemia   . Anemia   . Diabetes mellitus    TYPE 2  . Elevated PSA   . FH: colonic polyps   . Hyperlipidemia   . Memory loss   . Vegetarian    Past Surgical History:  Procedure Laterality Date  . CATARACT EXTRACTION    . COLONOSCOPY W/ POLYPECTOMY    . FOREIGN BODY OS    . HEMORRHOID SURGERY    . TRANSURETHRAL RESECTION OF PROSTATE N/A 09/13/2014   did not find cancer     A IV Location/Drains/Wounds Patient Lines/Drains/Airways Status   Active Line/Drains/Airways    Name:   Placement date:    Placement time:   Site:   Days:   Peripheral IV 07/13/2018 Right Antecubital   08/01/2018    1225    Antecubital   less than 1          Intake/Output Last 24 hours  Intake/Output Summary (Last 24 hours) at 08/02/2018 1555 Last data filed at 07/26/2018 1349 Gross per 24 hour  Intake 100 ml  Output 150 ml  Net -50 ml    Labs/Imaging Results for orders placed or performed during the hospital encounter of 08/07/2018 (from the past 48 hour(s))  SARS Coronavirus 2 (Hosp order,Performed in [place] lab via [facility] ID)     Status: Abnormal   Collection Time: 08/05/2018 11:25 AM   Specimen: Dry Nasal Swab ([facility] ID Now)  Result Value Ref Range   SARS Coronavirus 2 ([facility] Now) POSITIVE (A) NEGATIVE    Comment: RESULT CALLED TO, READ BACK BY AND VERIFIED WITH: CINDY Graysin Luczynski AT 1223 08/06/2018 BY K BARR (NOTE) SARS CoV 2 target nucleic acids are DETECTED.  The SARS-CoV-2 RNA is generally detectable in upper and lower respiratory specimens during the acute phase of infection. Positiveresults are indicative of active infection with SARS-CoV-2.   Clinical correlation with patient history and other diagnostic information is necessary to determine patient infection status.  Positive results do not rule out bacterial infection or coinfection with other viruses. The expected result is Negative. Fact Sheet for Patients: GolfingFamily.no Fact Sheet for Healthcare Providers: https://www.hernandez-brewer.com/ This  test is not yet approved or cleared by the [place] FDA and  has been authorized for detection and/or diagnosis of SARS-CoV-2 by FDA under an Emergency Use Authorization (EUA).  This EUA will remain in effect (meaning this test can be used)  for the duration of  the COVID19 declaration under Section 564(b)(1) of the Act, 21 U.S.C.  section [phone] 3(b)(1), unless the authorization is terminated or revoked sooner. Performed at [facility] [place],  [address] [place], [place] [address]   CBG monitoring, ED     Status: Abnormal   Collection Time: 08/14/2018 11:31 AM  Result Value Ref Range   Glucose-Capillary 225 (H) 70 - 99 mg/dL  Comprehensive metabolic panel     Status: Abnormal   Collection Time: 08/16/2018 11:48 AM  Result Value Ref Range   Sodium 134 (L) 135 - 145 mmol/L   Potassium 4.2 3.5 - 5.1 mmol/L   Chloride 98 98 - 111 mmol/L   CO2 22 22 - 32 mmol/L   Glucose, Bld 249 (H) 70 - 99 mg/dL   BUN 20 8 - 23 mg/dL   Creatinine, Ser 2.440.86 0.61 - 1.24 mg/dL   Calcium 9.4 8.9 - 01.010.3 mg/dL   Total Protein 7.8 6.5 - 8.1 g/dL   Albumin 3.6 3.5 - 5.0 g/dL   AST 42 (H) 15 - 41 U/L   ALT 54 (H) 0 - 44 U/L   Alkaline Phosphatase 76 38 - 126 U/L   Total Bilirubin 0.7 0.3 - 1.2 mg/dL   GFR calc non Af Amer >60 >60 mL/min   GFR calc Af Amer >60 >60 mL/min   Anion gap 14 5 - 15    Comment: Performed at [facility] [place], [address] [place], [place] [address]  CBC with Differential     Status: Abnormal   Collection Time: 09/07/2018 11:48 AM  Result Value Ref Range   WBC 3.7 (L) 4.0 - 10.5 K/uL   RBC 4.66 4.22 - 5.81 MIL/uL   Hemoglobin 13.4 13.0 - 17.0 g/dL   HCT 66.441.2 40.339.0 - 47.452.0 %   MCV 88.4 80.0 - 100.0 fL   MCH 28.8 26.0 - 34.0 pg   MCHC 32.5 30.0 - 36.0 g/dL   RDW 25.914.6 56.311.5 - 87.515.5 %   Platelets 243 150 - 400 K/uL   nRBC 0.0 0.0 - 0.2 %   Neutrophils Relative % 70 %   Neutro Abs 2.6 1.7 - 7.7 K/uL   Lymphocytes Relative 19 %   Lymphs Abs 0.7 0.7 - 4.0 K/uL   Monocytes Relative 11 %   Monocytes Absolute 0.4 0.1 - 1.0 K/uL   Eosinophils Relative 0 %   Eosinophils Absolute 0.0 0.0 - 0.5 K/uL   Basophils Relative 0 %   Basophils Absolute 0.0 0.0 - 0.1 K/uL   Immature Granulocytes 0 %   Abs Immature Granulocytes 0.00 0.00 - 0.07 K/uL    Comment: Performed at [facility] [place], [address] [place], [place] [address]  Lactic acid, plasma     Status: None   Collection Time: 08/30/2018 11:48 AM  Result  Value Ref Range   Lactic Acid, Venous 1.8 0.5 - 1.9 mmol/L    Comment: Performed at [facility] [place], [address] [place], [place] [address]  Urinalysis, Routine w reflex microscopic     Status: Abnormal   Collection Time: 09/06/2018 11:48 AM  Result Value Ref Range   Color, Urine  RED (A) YELLOW    Comment: BIOCHEMICALS MAY BE AFFECTED BY COLOR   APPearance TURBID (A) CLEAR   Specific Gravity, Urine  1.005 - 1.030    TEST NOT REPORTED DUE TO COLOR INTERFERENCE OF URINE PIGMENT   pH  5.0 - 8.0    TEST NOT REPORTED DUE TO COLOR INTERFERENCE OF URINE PIGMENT   Glucose, UA (A) NEGATIVE mg/dL    TEST NOT REPORTED DUE TO COLOR INTERFERENCE OF URINE PIGMENT   Hgb urine dipstick (A) NEGATIVE    TEST NOT REPORTED DUE TO COLOR INTERFERENCE OF URINE PIGMENT   Bilirubin Urine (A) NEGATIVE    TEST NOT REPORTED DUE TO COLOR INTERFERENCE OF URINE PIGMENT   Ketones, ur (A) NEGATIVE mg/dL    TEST NOT REPORTED DUE TO COLOR INTERFERENCE OF URINE PIGMENT   Protein, ur (A) NEGATIVE mg/dL    TEST NOT REPORTED DUE TO COLOR INTERFERENCE OF URINE PIGMENT   Nitrite (A) NEGATIVE    TEST NOT REPORTED DUE TO COLOR INTERFERENCE OF URINE PIGMENT   Leukocytes,Ua (A) NEGATIVE    TEST NOT REPORTED DUE TO COLOR INTERFERENCE OF URINE PIGMENT    Comment: Performed at [facility], [address] [place], [place] [address]  Urinalysis, Microscopic (reflex)     Status: Abnormal   Collection Time: 07/19/2018 11:48 AM  Result Value Ref Range   RBC / HPF >50 0 - 5 RBC/hpf   WBC, UA 6-10 0 - 5 WBC/hpf   Bacteria, UA MANY (A) NONE SEEN   Squamous Epithelial / LPF 0-5 0 - 5   Budding Yeast PRESENT    Granular Casts, UA PRESENT    RBC Casts, UA PRESENT    Amorphous Crystal PRESENT     Comment: Performed at [facility], [address] [place], [place] [address]  Troponin I - Once     Status: None   Collection Time: 07/17/2018 11:48 AM  Result Value Ref Range   Troponin I <0.03 <0.03 ng/mL     Comment: Performed at [facility], [address] [place], [place] [address]   Ct Head Wo Contrast  Result Date: 07/28/2018 CLINICAL DATA:  Pt sent from PCP office with his son who reports general decline in health over the past week. Denies cough, N/V/D. Pt is non verbal and has been non ambulatory for 1 week. Patient winces as if in pain when touched, patient unable to straighten head and neck per son, unsure if patient has had any type of injury, patient is covid +, no other complaints EXAM: CT HEAD WITHOUT CONTRAST CT CERVICAL SPINE WITHOUT CONTRAST TECHNIQUE: Multidetector CT imaging of the head and cervical spine was performed following the standard protocol without intravenous contrast. Multiplanar CT image reconstructions of the cervical spine were also generated. COMPARISON:  02/25/2012 FINDINGS: CT HEAD FINDINGS Brain: There is ventricular enlargement, greater than the sulci, raising suspicion for a chronic degree of hydrocephalus versus a predominance of central volume loss. Patchy periventricular white matter hypoattenuation is also noted consistent with mild chronic microvascular ischemic change. No parenchymal masses or mass effect, no evidence an infarct, no extra-axial masses or abnormal fluid collections and no intracranial hemorrhage. Vascular: No hyperdense vessel or unexpected calcification. Skull: Normal. Negative for fracture or focal lesion. Sinuses/Orbits: Globes and orbits are unremarkable. Sinuses and mastoid air cells are clear. Other: None. CT CERVICAL SPINE FINDINGS Alignment: No spondylolisthesis.  Head is tilted to the left. Skull base and vertebrae:  No acute fracture. No primary bone lesion or focal pathologic process. Soft tissues and spinal canal: No prevertebral fluid or swelling. No visible canal hematoma. Disc levels: Mild loss of disc height at C3-C4, C4-C5 and C6-C7. No convincing disc herniation. No significant disc bulging. Upper chest: No mass or  adenopathy.  No acute findings. Other: None. IMPRESSION: HEAD CT 1. No acute intracranial abnormalities. 2. Possible chronic hydrocephalus versus a predominance of central volume loss leading to ventriculomegaly. Mild chronic microvascular ischemic change. CERVICAL CT 1. No fracture or acute finding. Electronically Signed   By: Amie Portlandavid  Ormond M.D.   On: 2019/02/07 14:54   Ct Cervical Spine Wo Contrast  Result Date: November 02, 2018 CLINICAL DATA:  Pt sent from PCP office with his son who reports general decline in health over the past week. Denies cough, N/V/D. Pt is non verbal and has been non ambulatory for 1 week. Patient winces as if in pain when touched, patient unable to straighten head and neck per son, unsure if patient has had any type of injury, patient is covid +, no other complaints EXAM: CT HEAD WITHOUT CONTRAST CT CERVICAL SPINE WITHOUT CONTRAST TECHNIQUE: Multidetector CT imaging of the head and cervical spine was performed following the standard protocol without intravenous contrast. Multiplanar CT image reconstructions of the cervical spine were also generated. COMPARISON:  02/25/2012 FINDINGS: CT HEAD FINDINGS Brain: There is ventricular enlargement, greater than the sulci, raising suspicion for a chronic degree of hydrocephalus versus a predominance of central volume loss. Patchy periventricular white matter hypoattenuation is also noted consistent with mild chronic microvascular ischemic change. No parenchymal masses or mass effect, no evidence an infarct, no extra-axial masses or abnormal fluid collections and no intracranial hemorrhage. Vascular: No hyperdense vessel or unexpected calcification. Skull: Normal. Negative for fracture or focal lesion. Sinuses/Orbits: Globes and orbits are unremarkable. Sinuses and mastoid air cells are clear. Other: None. CT CERVICAL SPINE FINDINGS Alignment: No spondylolisthesis.  Head is tilted to the left. Skull base and vertebrae: No acute fracture. No primary  bone lesion or focal pathologic process. Soft tissues and spinal canal: No prevertebral fluid or swelling. No visible canal hematoma. Disc levels: Mild loss of disc height at C3-C4, C4-C5 and C6-C7. No convincing disc herniation. No significant disc bulging. Upper chest: No mass or adenopathy.  No acute findings. Other: None. IMPRESSION: HEAD CT 1. No acute intracranial abnormalities. 2. Possible chronic hydrocephalus versus a predominance of central volume loss leading to ventriculomegaly. Mild chronic microvascular ischemic change. CERVICAL CT 1. No fracture or acute finding. Electronically Signed   By: Amie Portlandavid  Ormond M.D.   On: 2019/02/07 14:54   Dg Chest Port 1 View  Result Date: November 02, 2018 CLINICAL DATA:  Pt is nonverbal,son states his health has gone downhill,no cough,being tested for covid EXAM: PORTABLE CHEST 1 VIEW COMPARISON:  None. FINDINGS: Cardiac silhouette is normal in size. No mediastinal or hilar masses. No evidence of adenopathy. Lung volumes are low. This leads to crowding of the bronchovascular structures at the bases. Allowing for this, the lungs are clear. No pleural effusion or pneumothorax. Skeletal structures are grossly intact. IMPRESSION: No active disease. Electronically Signed   By: Amie Portlandavid  Ormond M.D.   On: 2019/02/07 12:07    Pending Labs Unresulted Labs (From admission, onward)    Start     Ordered   11-21-2018 1125  Urine culture  ONCE - STAT,   STAT     11-21-2018 1124          Vitals/Pain Today's  Vitals   08/03/2018 1300 07/12/2018 1330 08/05/2018 1430 07/21/2018 1530  BP: 135/87 127/81 126/90 (!) 130/91  Pulse: 93 95 92 87  Resp: 15 (!) 21 16 15   Temp:      TempSrc:      SpO2: 97% 100% 96% 97%  Weight:      Height:        Isolation Precautions No active isolations  Medications Medications  0.9 %  sodium chloride infusion ( Intravenous New Bag/Given 07/24/2018 1226)  cefTRIAXone (ROCEPHIN) 1 g in sodium chloride 0.9 % 100 mL IVPB (0 g Intravenous Stopped 07/12/2018  1349)    Mobility non-ambulatory High fall risk   Focused Assessments Cardiac Assessment Handoff:  Cardiac Rhythm: Sinus tachycardia Lab Results  Component Value Date   TROPONINI <0.03 08/01/2018   No results found for: DDIMER Does the Patient currently have chest pain? No     R Recommendations: See Admitting Provider Note  Report given to:   Additional Notes: .

## 2018-07-29 NOTE — ED Notes (Signed)
Awaiting on transport from [place].

## 2018-07-29 NOTE — ED Provider Notes (Signed)
[facility] EMERGENCY DEPARTMENT Provider Note   CSN: [id number] Arrival date & time: 07/24/2018  1050    History   Chief Complaint Chief Complaint  Patient presents with   Weakness    HPI Blake Wilcox is a 78 y.o. male.     The history is provided by a relative and medical records. No language interpreter was used.  Weakness  Blake Wilcox is a 78 y.o. male who presents to the Emergency Department complaining of AMS. Level V caveat due to altered mental status. History is provided by the patient's son. The patient lives at home with his wife and two sons. He has a history of baseline dementia and is reliant on them for care. Over the last week he has experienced a significant decline with progressive weakness, confusion and poor appetite. He is now unable to ambulate and they have to carry him to the bathroom. He has poor oral intake. Initially he appeared to be leaning to the right more, but since Sunday he is now leaning to the left more. He is nonverbal and has been that way for the last several months. No reports of fevers, vomiting, diarrhea, sick contacts. 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/24/2015   Alzheimer's disease ([place]) 01/24/2015   Leukopenia 10/25/2013   Right lumbar radiculopathy 10/24/2013   Diabetes ([place]) 12/14/2012   Unspecified essential hypertension 12/14/2012   BPH (benign prostatic hyperplasia) 02/23/2012   Dementia ([place]) 02/23/2012   POLYURIA 03/21/2010   Hyperlipidemia 04/26/2009   ANEMIA-NOS 04/26/2009   COLONIC POLYPS, HX OF 05/29/2008    Past Surgical History:   Procedure Laterality Date   CATARACT EXTRACTION     COLONOSCOPY W/ POLYPECTOMY     FOREIGN BODY OS     HEMORRHOID SURGERY     TRANSURETHRAL RESECTION OF PROSTATE N/A 09/13/2014   did not find cancer        Home Medications    Prior to Admission medications   Medication Sig Start Date End Date Taking? Authorizing Provider  aspirin 81 MG tablet Take 81 mg by mouth daily.    [provider]  Blood Glucose Monitoring Suppl (ONE TOUCH ULTRA 2) W/DEVICE KIT Use to check blood sugars twice a day Dx E11.9 07/11/14   Hendricks Limes, MD  ferrous sulfate 325 (65 FE) MG tablet Take 1 tablet (325 mg total) by mouth 2 (two) times daily with a meal. 04/13/18   Paz, Alda Berthold, MD  glucose blood (ONE TOUCH ULTRA TEST) test strip TEST BLOOD SUGAR TWICE DAILY 09/01/17   Colon Branch, MD  Insulin Detemir (LEVEMIR FLEXTOUCH) 100 UNIT/ML Pen Inject 35 Units into the skin daily at 10 pm. 09/30/17   Colon Branch, MD  Insulin Pen Needle (B-D ULTRAFINE III SHORT PEN) 31G X 8 MM MISC To use w/ Levemir daily 10/14/17   Colon Branch, MD  metFORMIN (GLUCOPHAGE) 500 MG tablet Take 2 tablets every morning and 1 tablet every evening 04/07/18   Colon Branch, MD  Multiple Vitamin (MULTIVITAMIN) tablet Take 1 tablet by  mouth daily.    [provider]  Jonetta Speak LANCETS FINE MISC Use to help check blood sugars twice a day 01/22/18   Colon Branch, MD    Family History Family History  Problem Relation Age of Onset   Cancer Sister        BREAST   Diabetes Maternal Aunt    Stroke Neg Hx    Hearing loss Neg Hx    Colon cancer Neg Hx    Prostate cancer Neg Hx     Social History Social History   Tobacco Use   Smoking status: Never Smoker   Smokeless tobacco: Never Used  Substance Use Topics   Alcohol use: No    Alcohol/week: 0.0 standard drinks   Drug use: No     Allergies   Patient has no known allergies.   Review of Systems Review of Systems  Unable to perform ROS:  Patient nonverbal  Neurological: Positive for weakness.     Physical Exam Updated Vital Signs BP 126/90    Pulse 92    Temp 99.8 F (37.7 C) (Rectal)    Resp 16    Ht '6\' 2"'  (1.88 m)    Wt 81.6 kg    SpO2 96%    BMI 23.11 kg/m   Physical Exam Vitals signs and nursing note reviewed.  Constitutional:      Appearance: He is well-developed.     Comments: Lethargic. Chronically ill appearing.  HENT:     Head: Normocephalic and atraumatic.  Cardiovascular:     Rate and Rhythm: Regular rhythm.     Comments: Tachycardic Pulmonary:     Effort: Pulmonary effort is normal. No respiratory distress.  Abdominal:     Palpations: Abdomen is soft.     Tenderness: There is no abdominal tenderness. There is no guarding or rebound.  Musculoskeletal:        General: No tenderness.  Skin:    General: Skin is warm and dry.  Neurological:     Comments: Lethargic. Opens eyes to verbal stimuli. Cannot fully open the right eye. Profoundly weak and bilateral upper extremities, right greater than left. Pupils equal round and reactive. Does not follow commands. Does not move bilateral lower extremities but he does weakly resist on strength testing  Psychiatric:     Comments: Unable to assess.        ED Treatments / Results  Labs (all labs ordered are listed, but only abnormal results are displayed) Labs Reviewed  SARS CORONAVIRUS 2 (HOSP ORDER, PERFORMED IN [facility] VIA [facility] ID) - Abnormal; Notable for the following components:      Result Value   SARS Coronavirus 2 ([facility] ID Now) POSITIVE (*)    All other components within normal limits  COMPREHENSIVE METABOLIC PANEL - Abnormal; Notable for the following components:   Sodium 134 (*)    Glucose, Bld 249 (*)    AST 42 (*)    ALT 54 (*)    All other components within normal limits  CBC WITH DIFFERENTIAL/PLATELET - Abnormal; Notable for the following components:   WBC 3.7 (*)    All other components within normal limits  URINALYSIS,  ROUTINE W REFLEX MICROSCOPIC - Abnormal; Notable for the following components:   Color, Urine RED (*)    APPearance TURBID (*)    Glucose, UA   (*)    Value: TEST NOT REPORTED DUE TO COLOR INTERFERENCE OF URINE PIGMENT   Hgb urine dipstick   (*)  Value: TEST NOT REPORTED DUE TO COLOR INTERFERENCE OF URINE PIGMENT   Bilirubin Urine   (*)    Value: TEST NOT REPORTED DUE TO COLOR INTERFERENCE OF URINE PIGMENT   Ketones, ur   (*)    Value: TEST NOT REPORTED DUE TO COLOR INTERFERENCE OF URINE PIGMENT   Protein, ur   (*)    Value: TEST NOT REPORTED DUE TO COLOR INTERFERENCE OF URINE PIGMENT   Nitrite   (*)    Value: TEST NOT REPORTED DUE TO COLOR INTERFERENCE OF URINE PIGMENT   Leukocytes,Ua   (*)    Value: TEST NOT REPORTED DUE TO COLOR INTERFERENCE OF URINE PIGMENT   All other components within normal limits  URINALYSIS, MICROSCOPIC (REFLEX) - Abnormal; Notable for the following components:   Bacteria, UA MANY (*)    All other components within normal limits  CBG MONITORING, ED - Abnormal; Notable for the following components:   Glucose-Capillary 225 (*)    All other components within normal limits  URINE CULTURE  LACTIC ACID, PLASMA  TROPONIN I    EKG EKG Interpretation  Date/Time:  Thursday July 29 2018 11:01:30 EDT Ventricular Rate:  103 PR Interval:    QRS Duration: 128 QT Interval:  365 QTC Calculation: 478 R Axis:   -60 Text Interpretation:  Sinus tachycardia RBBB and LAFB no prior available for comparison Confirmed by Quintella Reichert [phone]) on 07/27/2018 11:23:18 AM   Radiology Ct Head Wo Contrast  Result Date: 07/30/2018 CLINICAL DATA:  Pt sent from PCP office with his son who reports general decline in health over the past week. Denies cough, N/V/D. Pt is non verbal and has been non ambulatory for 1 week. Patient winces as if in pain when touched, patient unable to straighten head and neck per son, unsure if patient has had any type of injury, patient is covid +,  no other complaints EXAM: CT HEAD WITHOUT CONTRAST CT CERVICAL SPINE WITHOUT CONTRAST TECHNIQUE: Multidetector CT imaging of the head and cervical spine was performed following the standard protocol without intravenous contrast. Multiplanar CT image reconstructions of the cervical spine were also generated. COMPARISON:  02/25/2012 FINDINGS: CT HEAD FINDINGS Brain: There is ventricular enlargement, greater than the sulci, raising suspicion for a chronic degree of hydrocephalus versus a predominance of central volume loss. Patchy periventricular white matter hypoattenuation is also noted consistent with mild chronic microvascular ischemic change. No parenchymal masses or mass effect, no evidence an infarct, no extra-axial masses or abnormal fluid collections and no intracranial hemorrhage. Vascular: No hyperdense vessel or unexpected calcification. Skull: Normal. Negative for fracture or focal lesion. Sinuses/Orbits: Globes and orbits are unremarkable. Sinuses and mastoid air cells are clear. Other: None. CT CERVICAL SPINE FINDINGS Alignment: No spondylolisthesis.  Head is tilted to the left. Skull base and vertebrae: No acute fracture. No primary bone lesion or focal pathologic process. Soft tissues and spinal canal: No prevertebral fluid or swelling. No visible canal hematoma. Disc levels: Mild loss of disc height at C3-C4, C4-C5 and C6-C7. No convincing disc herniation. No significant disc bulging. Upper chest: No mass or adenopathy.  No acute findings. Other: None. IMPRESSION: HEAD CT 1. No acute intracranial abnormalities. 2. Possible chronic hydrocephalus versus a predominance of central volume loss leading to ventriculomegaly. Mild chronic microvascular ischemic change. CERVICAL CT 1. No fracture or acute finding. Electronically Signed   By: Lajean Manes M.D.   On: 07/16/2018 14:54   Ct Cervical Spine Wo Contrast  Result Date: 07/13/2018 CLINICAL DATA:  Pt sent from PCP office with his son who reports  general decline in health over the past week. Denies cough, N/V/D. Pt is non verbal and has been non ambulatory for 1 week. Patient winces as if in pain when touched, patient unable to straighten head and neck per son, unsure if patient has had any type of injury, patient is covid +, no other complaints EXAM: CT HEAD WITHOUT CONTRAST CT CERVICAL SPINE WITHOUT CONTRAST TECHNIQUE: Multidetector CT imaging of the head and cervical spine was performed following the standard protocol without intravenous contrast. Multiplanar CT image reconstructions of the cervical spine were also generated. COMPARISON:  02/25/2012 FINDINGS: CT HEAD FINDINGS Brain: There is ventricular enlargement, greater than the sulci, raising suspicion for a chronic degree of hydrocephalus versus a predominance of central volume loss. Patchy periventricular white matter hypoattenuation is also noted consistent with mild chronic microvascular ischemic change. No parenchymal masses or mass effect, no evidence an infarct, no extra-axial masses or abnormal fluid collections and no intracranial hemorrhage. Vascular: No hyperdense vessel or unexpected calcification. Skull: Normal. Negative for fracture or focal lesion. Sinuses/Orbits: Globes and orbits are unremarkable. Sinuses and mastoid air cells are clear. Other: None. CT CERVICAL SPINE FINDINGS Alignment: No spondylolisthesis.  Head is tilted to the left. Skull base and vertebrae: No acute fracture. No primary bone lesion or focal pathologic process. Soft tissues and spinal canal: No prevertebral fluid or swelling. No visible canal hematoma. Disc levels: Mild loss of disc height at C3-C4, C4-C5 and C6-C7. No convincing disc herniation. No significant disc bulging. Upper chest: No mass or adenopathy.  No acute findings. Other: None. IMPRESSION: HEAD CT 1. No acute intracranial abnormalities. 2. Possible chronic hydrocephalus versus a predominance of central volume loss leading to ventriculomegaly. Mild  chronic microvascular ischemic change. CERVICAL CT 1. No fracture or acute finding. Electronically Signed   By: Lajean Manes M.D.   On: 07/18/2018 14:54   Dg Chest Port 1 View  Result Date: 07/17/2018 CLINICAL DATA:  Pt is nonverbal,son states his health has gone downhill,no cough,being tested for covid EXAM: PORTABLE CHEST 1 VIEW COMPARISON:  None. FINDINGS: Cardiac silhouette is normal in size. No mediastinal or hilar masses. No evidence of adenopathy. Lung volumes are low. This leads to crowding of the bronchovascular structures at the bases. Allowing for this, the lungs are clear. No pleural effusion or pneumothorax. Skeletal structures are grossly intact. IMPRESSION: No active disease. Electronically Signed   By: Lajean Manes M.D.   On: 07/26/2018 12:07    Procedures Procedures (including critical care time)  Medications Ordered in ED Medications  0.9 %  sodium chloride infusion ( Intravenous New Bag/Given 07/25/2018 1226)  cefTRIAXone (ROCEPHIN) 1 g in sodium chloride 0.9 % 100 mL IVPB (0 g Intravenous Stopped 07/30/2018 1349)     Initial Impression / Assessment and Plan / ED Course  I have reviewed the triage vital signs and the nursing notes.  Pertinent labs & imaging results that were available during my care of the patient were reviewed by me and considered in my medical decision making (see chart for details).        Patient with history of advanced dementia here for evaluation of progressive decline in change in mental status over the last week. He is ill appearing on evaluation and dehydrated. UA is concerning for UTI and he was treated with antibiotics. He was treated with IV fluids for dehydration. He did have some hypoxia on his PCP visit earlier today, hypoxia resolved  in the emergency department without intervention. His rapid COVID swab is positive here, no known exposures. He was placed on isolation precautions. Son updated of findings of studies and recommendation for  admission and he is in agreement with treatment plan. Hospitalist, Dr. Loleta Books, consulted for admission.  Blake Wilcox was evaluated in Emergency Department on 07/25/2018 for the symptoms described in the history of present illness. He was evaluated in the context of the global COVID-19 pandemic, which necessitated consideration that the patient might be at risk for infection with the SARS-CoV-2 virus that causes COVID-19. Institutional protocols and algorithms that pertain to the evaluation of patients at risk for COVID-19 are in a state of rapid change based on information released by regulatory bodies including the CDC and federal and state organizations. These policies and algorithms were followed during the patient's care in the ED.  Final Clinical Impressions(s) / ED Diagnoses   Final diagnoses:  COVID-19 virus infection  Weakness  Acute UTI  Dehydration    ED Discharge Orders    None       Quintella Reichert, MD 07/14/2018 1529

## 2018-07-29 NOTE — Progress Notes (Signed)
Pre visit review using our clinic review tool, if applicable. No additional management support is needed unless otherwise documented below in the visit note. 

## 2018-07-30 ENCOUNTER — Encounter (HOSPITAL_COMMUNITY): Payer: Self-pay | Admitting: Family Medicine

## 2018-07-30 DIAGNOSIS — U071 COVID-19: Secondary | ICD-10-CM

## 2018-07-30 DIAGNOSIS — G309 Alzheimer's disease, unspecified: Secondary | ICD-10-CM

## 2018-07-30 DIAGNOSIS — N390 Urinary tract infection, site not specified: Secondary | ICD-10-CM | POA: Diagnosis present

## 2018-07-30 DIAGNOSIS — Z794 Long term (current) use of insulin: Secondary | ICD-10-CM

## 2018-07-30 DIAGNOSIS — D649 Anemia, unspecified: Secondary | ICD-10-CM

## 2018-07-30 DIAGNOSIS — E860 Dehydration: Secondary | ICD-10-CM | POA: Diagnosis present

## 2018-07-30 DIAGNOSIS — G9341 Metabolic encephalopathy: Secondary | ICD-10-CM

## 2018-07-30 DIAGNOSIS — F0280 Dementia in other diseases classified elsewhere without behavioral disturbance: Secondary | ICD-10-CM

## 2018-07-30 DIAGNOSIS — E1169 Type 2 diabetes mellitus with other specified complication: Secondary | ICD-10-CM

## 2018-07-30 LAB — CBC WITH DIFFERENTIAL/PLATELET
Abs Immature Granulocytes: 0.02 10*3/uL (ref 0.00–0.07)
Abs Immature Granulocytes: 0.01 10*3/uL (ref 0.00–0.07)
Basophils Absolute: 0 10*3/uL (ref 0.0–0.1)
Basophils Absolute: 0 10*3/uL (ref 0.0–0.1)
Basophils Relative: 0 %
Basophils Relative: 0 %
Eosinophils Absolute: 0 10*3/uL (ref 0.0–0.5)
Eosinophils Absolute: 0 10*3/uL (ref 0.0–0.5)
Eosinophils Relative: 0 %
Eosinophils Relative: 0 %
HCT: 36.2 % — ABNORMAL LOW (ref 39.0–52.0)
HCT: 38.9 % — ABNORMAL LOW (ref 39.0–52.0)
Hemoglobin: 12 g/dL — ABNORMAL LOW (ref 13.0–17.0)
Hemoglobin: 13 g/dL (ref 13.0–17.0)
Immature Granulocytes: 1 %
Immature Granulocytes: 0 %
Lymphocytes Relative: 32 %
Lymphocytes Relative: 31 %
Lymphs Abs: 1.2 10*3/uL (ref 0.7–4.0)
Lymphs Abs: 1.1 10*3/uL (ref 0.7–4.0)
MCH: 28.9 pg (ref 26.0–34.0)
MCH: 29.1 pg (ref 26.0–34.0)
MCHC: 33.1 g/dL (ref 30.0–36.0)
MCHC: 33.4 g/dL (ref 30.0–36.0)
MCV: 87.2 fL (ref 80.0–100.0)
MCV: 87.2 fL (ref 80.0–100.0)
Monocytes Absolute: 0.4 10*3/uL (ref 0.1–1.0)
Monocytes Absolute: 0.3 10*3/uL (ref 0.1–1.0)
Monocytes Relative: 11 %
Monocytes Relative: 9 %
Neutro Abs: 2.1 10*3/uL (ref 1.7–7.7)
Neutro Abs: 2 10*3/uL (ref 1.7–7.7)
Neutrophils Relative %: 56 %
Neutrophils Relative %: 60 %
Platelets: 240 10*3/uL (ref 150–400)
Platelets: 245 10*3/uL (ref 150–400)
RBC: 4.15 MIL/uL — ABNORMAL LOW (ref 4.22–5.81)
RBC: 4.46 MIL/uL (ref 4.22–5.81)
RDW: 14.2 % (ref 11.5–15.5)
RDW: 14.5 % (ref 11.5–15.5)
WBC: 3.7 10*3/uL — ABNORMAL LOW (ref 4.0–10.5)
WBC: 3.4 10*3/uL — ABNORMAL LOW (ref 4.0–10.5)
nRBC: 0 % (ref 0.0–0.2)
nRBC: 0 % (ref 0.0–0.2)

## 2018-07-30 LAB — BASIC METABOLIC PANEL
Anion gap: 10 (ref 5–15)
BUN: 16 mg/dL (ref 8–23)
CO2: 25 mmol/L (ref 22–32)
Calcium: 8.9 mg/dL (ref 8.9–10.3)
Chloride: 102 mmol/L (ref 98–111)
Creatinine, Ser: 0.8 mg/dL (ref 0.61–1.24)
GFR calc Af Amer: >60 mL/min (ref >60)
GFR calc non Af Amer: >60 mL/min (ref >60)
Glucose, Bld: 167 mg/dL — ABNORMAL HIGH (ref 70–99)
Potassium: 4.2 mmol/L (ref 3.5–5.1)
Sodium: 137 mmol/L (ref 135–145)

## 2018-07-30 LAB — D-DIMER, QUANTITATIVE: D-Dimer, Quant: 8.92 ug/mL-FEU — ABNORMAL HIGH (ref 0.00–0.50)

## 2018-07-30 LAB — FERRITIN: Ferritin: 90 ng/mL (ref 24–336)

## 2018-07-30 LAB — COMPREHENSIVE METABOLIC PANEL
ALT: 63 U/L — ABNORMAL HIGH (ref 0–44)
AST: 47 U/L — ABNORMAL HIGH (ref 15–41)
Albumin: 3.4 g/dL — ABNORMAL LOW (ref 3.5–5.0)
Alkaline Phosphatase: 77 U/L (ref 38–126)
Anion gap: 13 (ref 5–15)
BUN: 14 mg/dL (ref 8–23)
CO2: 26 mmol/L (ref 22–32)
Calcium: 9.3 mg/dL (ref 8.9–10.3)
Chloride: 99 mmol/L (ref 98–111)
Creatinine, Ser: 0.77 mg/dL (ref 0.61–1.24)
GFR calc Af Amer: >60 mL/min (ref >60)
GFR calc non Af Amer: >60 mL/min (ref >60)
Glucose, Bld: 162 mg/dL — ABNORMAL HIGH (ref 70–99)
Potassium: 4 mmol/L (ref 3.5–5.1)
Sodium: 138 mmol/L (ref 135–145)
Total Bilirubin: 0.4 mg/dL (ref 0.3–1.2)
Total Protein: 7.9 g/dL (ref 6.5–8.1)

## 2018-07-30 LAB — CBG MONITORING, ED
Glucose-Capillary: 145 mg/dL — ABNORMAL HIGH (ref 70–99)
Glucose-Capillary: 144 mg/dL — ABNORMAL HIGH (ref 70–99)

## 2018-07-30 LAB — GLUCOSE, CAPILLARY
Glucose-Capillary: 156 mg/dL — ABNORMAL HIGH (ref 70–99)
Glucose-Capillary: 175 mg/dL — ABNORMAL HIGH (ref 70–99)

## 2018-07-30 LAB — C-REACTIVE PROTEIN: CRP: 1.3 mg/dL — ABNORMAL HIGH (ref <1.0)

## 2018-07-30 LAB — TROPONIN I: Troponin I: <0.03 ng/mL (ref <0.03)

## 2018-07-30 LAB — PROCALCITONIN: Procalcitonin: <0.1 ng/mL

## 2018-07-30 MED ORDER — ZINC SULFATE 220 (50 ZN) MG PO CAPS
220.0000 mg | ORAL_CAPSULE | Freq: Every day | ORAL | Status: DC
  Administered 2018-07-30 – 2018-07-31 (×2): 220 mg via ORAL
  Filled 2018-07-30 (×2): qty 1

## 2018-07-30 MED ORDER — DEXAMETHASONE 6 MG PO TABS
6.0000 mg | ORAL_TABLET | Freq: Every day | ORAL | Status: DC
  Administered 2018-07-30 – 2018-07-31 (×2): 6 mg via ORAL
  Filled 2018-07-30 (×2): qty 1

## 2018-07-30 MED ORDER — ACETAMINOPHEN 325 MG PO TABS: 650.0000 mg | ORAL_TABLET | Freq: Four times a day (QID) | ORAL | Status: DC | PRN

## 2018-07-30 MED ORDER — VANCOMYCIN HCL 10 G IV SOLR
1750.0000 mg | INTRAVENOUS | Status: DC
  Administered 2018-07-30: 1750 mg via INTRAVENOUS
  Filled 2018-07-30 (×2): qty 1750

## 2018-07-30 MED ORDER — SODIUM CHLORIDE 0.9 % IV SOLN
1.0000 g | INTRAVENOUS | Status: DC
  Administered 2018-07-30: 1 g via INTRAVENOUS
  Filled 2018-07-30: qty 10

## 2018-07-30 MED ORDER — ENOXAPARIN SODIUM 40 MG/0.4ML ~~LOC~~ SOLN
40.0000 mg | SUBCUTANEOUS | Status: DC
  Administered 2018-07-30: 40 mg via SUBCUTANEOUS
  Filled 2018-07-30: qty 0.4

## 2018-07-30 MED ORDER — GUAIFENESIN-DM 100-10 MG/5ML PO SYRP: 10.0000 mL | ORAL_SOLUTION | ORAL | Status: DC | PRN

## 2018-07-30 MED ORDER — SODIUM CHLORIDE 0.9 % IV SOLN
INTRAVENOUS | Status: DC
  Administered 2018-07-30 – 2018-07-31 (×2): via INTRAVENOUS

## 2018-07-30 MED ORDER — INSULIN ASPART 100 UNIT/ML ~~LOC~~ SOLN
0.0000 [IU] | Freq: Three times a day (TID) | SUBCUTANEOUS | Status: DC
  Administered 2018-07-30: 3 [IU] via SUBCUTANEOUS
  Administered 2018-07-31: 5 [IU] via SUBCUTANEOUS
  Administered 2018-07-31: 3 [IU] via SUBCUTANEOUS

## 2018-07-30 MED ORDER — VITAMIN C 500 MG PO TABS
500.0000 mg | ORAL_TABLET | Freq: Every day | ORAL | Status: DC
  Administered 2018-07-30 – 2018-07-31 (×2): 500 mg via ORAL
  Filled 2018-07-30 (×2): qty 1

## 2018-07-30 MED ORDER — INSULIN ASPART 100 UNIT/ML ~~LOC~~ SOLN
0.0000 [IU] | SUBCUTANEOUS | Status: DC
  Administered 2018-07-30: 3 [IU] via SUBCUTANEOUS

## 2018-07-30 MED ORDER — ORAL CARE MOUTH RINSE
15.0000 mL | Freq: Two times a day (BID) | OROMUCOSAL | Status: DC
  Administered 2018-07-30 – 2018-07-31 (×2): 15 mL via OROMUCOSAL

## 2018-07-30 NOTE — ED Notes (Signed)
[facility] notified of pt transport

## 2018-07-30 NOTE — ED Notes (Signed)
Incontinent of urine. Peri care and repositioning performed. PT tolerated well.

## 2018-07-30 NOTE — ED Notes (Signed)
cbg 144

## 2018-07-30 NOTE — Progress Notes (Signed)
Ok to change duration of ceftriaxone to 5d total per Dr. Loleta Books for UTI.   Onnie Boer, PharmD, BCIDP, AAHIVP, CPP Infectious Disease Pharmacist 07/30/2018 11:43 AM

## 2018-07-30 NOTE — Progress Notes (Signed)
Inpatient Diabetes Program Recommendations  AACE/ADA: New Consensus Statement on Inpatient Glycemic Control (2015)  Target Ranges:  Prepandial:   less than 140 mg/dL      Peak postprandial:   less than 180 mg/dL (1-2 hours)      Critically ill patients:  140 - 180 mg/dL   Lab Results  Component Value Date   GLUCAP 144 (H) 07/30/2018   HGBA1C 7.8 (H) 04/07/2018    Review of Glycemic Control  Diabetes history: DM 2 Outpatient Diabetes medications: Levemir 35 units qhs, Metformin 500 mg bid Current orders for Inpatient glycemic control:  Novolog 0-15 units Q4 hours  Decadron 6 mg ordered Daily  Inpatient Diabetes Program Recommendations:    If glucose trends increase, consider ordering Levemir 15 units qhs (half of home dose), while on steroids.  Thanks,  Tama Headings RN, MSN, BC-ADM Inpatient Diabetes Coordinator Team Pager [phone] (8a-5p)

## 2018-07-30 NOTE — ED Notes (Signed)
Pt incontinent of stool , peri care given. PT tolerated well. Opened eyes, skin warm and dry.

## 2018-07-30 NOTE — Progress Notes (Signed)
Patient transferred from [facility], arrived 1100hrs.  Dr. Loleta Books notified of arrival.  TCT to contact, [phone], [facility], no answer.  Will try again later.

## 2018-07-30 NOTE — ED Provider Notes (Signed)
Patient awaiting transfer to [facility]. Patient without acute events overnight. No change in current exam. Patient nonverbal, no acute complaints.   Quintella Reichert, MD 07/30/18 [phone]

## 2018-07-30 NOTE — Assessment & Plan Note (Signed)
Acute deterioration of dementia and mental status: The patient has advanced dementia, saw neurology 04/14/2018, they discontinue Namenda and Aricept they started a conversation about the memory care unit. The patient has an acute deterioration for the last 4 days, currently not eating, not communicating or walking. DDX is large but includes pneumonia, UTI, COVID-19, recent stroke, uncontrolled DM versus others. He did report recent hematuria but that has been on and off issue. I spoke with [name], his son, we agreed to send him to the emergency room for further evaluation. After the evaluation, he might need to be admitted however depending on the findings, they are open to the idea of going home with palliative care or hospice. Spoke with the ER doctor, I appreciate her help Addendum: Patient was admitted, he tested + for COVID-19

## 2018-07-30 NOTE — Progress Notes (Signed)
Spoke with son Marijean Bravo, updated on plan of care and answered questions.  Advised would update once seen by MD.

## 2018-07-30 NOTE — Progress Notes (Signed)
Inpatient Diabetes Program Recommendations  AACE/ADA: New Consensus Statement on Inpatient Glycemic Control (2015)  Target Ranges:  Prepandial:   less than 140 mg/dL      Peak postprandial:   less than 180 mg/dL (1-2 hours)      Critically ill patients:  140 - 180 mg/dL   Lab Results  Component Value Date   GLUCAP 144 (H) 07/30/2018   HGBA1C 7.8 (H) 04/07/2018    Review of Glycemic Control  Diabetes history: DM2 Outpatient Diabetes medications: Levemir 35 units QHS, metformin 1000 mg in am and 500 mg QPM Current orders for Inpatient glycemic control: Novolog 0-15 units Q4H On Decadron 6 mg QD  Will likely need part of Levemir home dose.  Inpatient Diabetes Program Recommendations:     Levemir 10 units QHS  Continue to follow. Watch trends.   Thank you. Lorenda Peck, RD, LDN, CDE Inpatient Diabetes Coordinator [phone]

## 2018-07-30 NOTE — Progress Notes (Signed)
Pharmacy Antibiotic Note  Blake Wilcox is a 78 y.o. male admitted on 08-02-2018. Pharmacy has been consulted for Vancomycin dosing for UTI.  Plan:  Vancomycin 1750 mg IV q24h.  Goal AUC 400-550.  Expected AUC: 474 using rounded SCr 1  Follow up renal function, culture results, and clinical course.   Height: 6\' 2"  (188 cm) Weight: 171 lb 1.2 oz (77.6 kg) IBW/kg (Calculated) : 82.2  Temp (24hrs), Avg:99.3 F (37.4 C), Min:97.8 F (36.6 C), Max:100.6 F (38.1 C)  Recent Labs  Lab 2018-08-02 1148 07/30/18 0740  WBC 3.7* 3.7*  CREATININE 0.86 0.80  LATICACIDVEN 1.8  --     Estimated Creatinine Clearance: 87.6 mL/min (by C-G formula based on SCr of 0.8 mg/dL).    No Known Allergies  Antimicrobials this admission: 6/18 Ceftriaxone >> 6/19 6/19 Vancomycin >>   Dose adjustments this admission:   Microbiology results: 6/18 SARS Coronavirus 2 :  Positive 6/18 UCx: >100k Staph species, coagulase negative  Thank you for allowing pharmacy to be a part of this patient's care.  Gretta Arab PharmD, BCPS Clinical Pharmacist Clinical pharmacist phone 7am- 5pm: [phone] 07/30/2018 4:26 PM

## 2018-07-30 NOTE — Progress Notes (Signed)
Condom catheter placed on patient, urine dark cranberry, Dr. Loleta Books notified of gross hematuria.

## 2018-07-30 NOTE — ED Notes (Signed)
Updated [name] (son)  On father

## 2018-07-30 NOTE — H&P (Signed)
History and Physical  Patient Name: Blake Wilcox     EHM:[id number]    DOB: 05/29/1940    DOA: 07/23/2018 PCP: Colon Branch, MD  Patient coming from: Home --> PCP's office --> [facility]  Chief Complaint: Decreased activity      HPI: Blake Wilcox is a 78 y.o. M with hx advanced dementia, home-dwelling, IDDM, and anemia who presented with 4 days confusion, decreased mentation.  All history collected from sons via phone due to patient's poor mentation.  Evidently, he was diagnosed with dementia in 2013, had a MMSE 18/30 as far ago as 2016 and 9/30 as long ago as Feb 2019.  In the last six months (per Neuro note in Mar 2020), he had progressed to needing total assist with toileting, feeding, "minimal verbal response", and pocketing pills.  Taken off Aricept/Namenda at that time.  In that context, family noted in the last week that he was even more weak than usual.  Mentation was declining.  He would not eat at all.  They deny fever, cough, dyspnea.  No focal weakness, no complaints of pain anywhere.    They brought him to the ER to be evaluated, and there he was hypoxic to 88% and sent downstairs.  In ER, CXR without opacity, SpO2 normal on room air.  CT head and C-spine unremarkable for stroke or fracture or IC bleeding.    SARS-CoV-2+, lymphopenia, mild transaminitis.  Lactate normal, troponin undetectable.  Creatinine normal, hyperglycemia.  Urine was "almost black in color" per EDP, and urinalysis showed many bacteria, white blood cells.  He was given ceftriaxone, IV fluids, the hospitalist service were asked to accept for admission for dehydration, UTI, and acute metabolic encephalopathy.      ROS: Review of Systems  Unable to perform ROS: Dementia          Past Medical History:  Diagnosis Date   Anemia    Anemia    Diabetes mellitus    TYPE 2   Elevated PSA    FH: colonic polyps    Hyperlipidemia    Memory loss    Vegetarian     Past Surgical History:   Procedure Laterality Date   CATARACT EXTRACTION     COLONOSCOPY W/ POLYPECTOMY     FOREIGN BODY OS     HEMORRHOID SURGERY     TRANSURETHRAL RESECTION OF PROSTATE N/A 09/13/2014   did not find cancer    Social History: Patient lives with his two sons and his daughter in [employer].  The patient walked unassisted until about the last week.  Nonsmoker.  From [place].    No Known Allergies  Family history: family history includes Cancer in his sister; Diabetes in his maternal aunt.  Prior to Admission medications   Medication Sig Start Date End Date Taking? Authorizing Provider  aspirin 81 MG tablet Take 81 mg by mouth daily.   Yes [provider]  Blood Glucose Monitoring Suppl (ONE TOUCH ULTRA 2) W/DEVICE KIT Use to check blood sugars twice a day Dx E11.9 07/11/14  Yes Hendricks Limes, MD  ferrous sulfate 325 (65 FE) MG tablet Take 1 tablet (325 mg total) by mouth 2 (two) times daily with a meal. 04/13/18  Yes Paz, Alda Berthold, MD  finasteride (PROSCAR) 5 MG tablet Take 5 mg by mouth daily. 06/14/18  Yes [provider]  glucose blood (ONE TOUCH ULTRA TEST) test strip TEST BLOOD SUGAR TWICE DAILY 09/01/17  Yes Colon Branch, MD  Insulin  Detemir (LEVEMIR FLEXTOUCH) 100 UNIT/ML Pen Inject 35 Units into the skin daily at 10 pm. 09/30/17  Yes Paz, Alda Berthold, MD  Insulin Pen Needle (B-D ULTRAFINE III SHORT PEN) 31G X 8 MM MISC To use w/ Levemir daily 10/14/17  Yes Colon Branch, MD  metFORMIN (GLUCOPHAGE) 500 MG tablet Take 2 tablets every morning and 1 tablet every evening Patient taking differently: Take 500 mg by mouth See admin instructions. Take 2 tablets every morning and 1 tablet every evening 04/07/18  Yes Paz, Alda Berthold, MD  Multiple Vitamin (MULTIVITAMIN) tablet Take 1 tablet by mouth daily.   Yes [provider]  Jonetta Speak LANCETS FINE MISC Use to help check blood sugars twice a day 01/22/18  Yes Colon Branch, MD       Physical Exam: BP (!) 143/88 (BP Location: Right  Arm)    Pulse 84    Temp 97.8 F (36.6 C) (Oral)    Resp 14    Ht '6\' 2"'  (1.88 m)    Wt 77.6 kg    SpO2 97%    BMI 21.96 kg/m  General appearance: Elderly adult male, eyes closed, appears sluggish.     Eyes: Anicteric, conjunctiva pink, lids and lashes normal.  Refuses to open eyes for further exam. ENT: No nasal deformity, discharge, epistaxis.  Unable to assess hearing.  Lips and OP appear dry, will not open mouth for further exam.   Neck: No neck masses.  Trachea midline.   Skin: Warm and dry.  No jaundice.  No suspicious rashes or lesions. Cardiac: RRR, nl S1-S2, no murmurs appreciated.  Capillary refill is brisk.  JVP not visible.  No LE edema.  Radial pulses 2+ and symmetric. Respiratory: Normal respiratory rate and rhythm.  CTAB without rales or wheezes. Abdomen: Abdomen soft.  No grimace to palpation, no gaudring. No ascites, distension, hepatosplenomegaly.   MSK: Diffuse loss of subcutaneous muscle mass and fat.  Temporal wasting.  Thenar wasting bilaterally.. Neuro: Moves both arms weakly but spontaneously, otherwise does not follow commands.  Grimaces to noxious stimuli, whispers fluent but nonsensical utterances in response to touch.    Psych: Unable to assess.     Labs on Admission:  I have personally reviewed following labs and imaging studies: CBC: Recent Labs  Lab 07/28/2018 1148 07/30/18 0740  WBC 3.7* 3.7*  NEUTROABS 2.6 2.1  HGB 13.4 12.0*  HCT 41.2 36.2*  MCV 88.4 87.2  PLT 243 539   Basic Metabolic Panel: Recent Labs  Lab 08/01/2018 1148 07/30/18 0740  NA 134* 137  K 4.2 4.2  CL 98 102  CO2 22 25  GLUCOSE 249* 167*  BUN 20 16  CREATININE 0.86 0.80  CALCIUM 9.4 8.9   GFR: Estimated Creatinine Clearance: 87.6 mL/min (by C-G formula based on SCr of 0.8 mg/dL).  Liver Function Tests: Recent Labs  Lab 07/23/2018 1148  AST 42*  ALT 54*  ALKPHOS 76  BILITOT 0.7  PROT 7.8  ALBUMIN 3.6   Cardiac Enzymes: Recent Labs  Lab 07/22/2018 1148  TROPONINI  <0.03   CBG: Recent Labs  Lab 07/16/2018 1131 07/30/18 0150 07/30/18 0557  GLUCAP 225* 145* 144*   Sepsis Labs: Lactic acid 1.8  Recent Results (from the past 240 hour(s))  Urine culture     Status: Abnormal (Preliminary result)   Collection Time: 08/07/2018 11:25 AM   Specimen: Urine, Clean Catch  Result Value Ref Range Status   Specimen Description   Final  URINE, CLEAN CATCH Performed at [facility], [address] [place], [place] [address]    Special Requests   Final    NONE Performed at [facility], [address] [place], [place] [address]    Culture (A)  Final    >=100,000 COLONIES/mL STAPHYLOCOCCUS SPECIES (COAGULASE NEGATIVE) SUSCEPTIBILITIES TO FOLLOW Performed at [facility], [address] [place], [place] [address]    Report Status PENDING  Incomplete  SARS Coronavirus 2 (Hosp order,Performed in [facility] lab via [employer] ID)     Status: Abnormal   Collection Time: 07/24/2018 11:25 AM   Specimen: Dry Nasal Swab ([employer] ID Now)  Result Value Ref Range Status   SARS Coronavirus 2 ([employer] ID Now) POSITIVE (A) NEGATIVE Final    Comment: RESULT CALLED TO, READ BACK BY AND VERIFIED WITH: CINDY REED AT 1223 07/16/2018 BY K BARR (NOTE) SARS CoV 2 target nucleic acids are DETECTED.  The SARS-CoV-2 RNA is generally detectable in upper and lower respiratory specimens during the acute phase of infection. Positiveresults are indicative of active infection with SARS-CoV-2.   Clinical correlation with patient history and other diagnostic information is necessary to determine patient infection status.  Positive results do not rule out bacterial infection or coinfection with other viruses. The expected result is Negative. Fact Sheet for Patients: GolfingFamily.no Fact Sheet for Healthcare Providers: https://www.hernandez-brewer.com/ This test is not yet approved or cleared by the [place] FDA and    has been authorized for detection and/or diagnosis of SARS-CoV-2 by FDA under an Emergency Use Authorization (EUA).  This EUA will remain in effect (meaning this test can be used)  for the duration of  the COVID19 declaration under Section 564(b)(1) of the Act, 21 U.S.C.  section [phone] 3(b)(1), unless the authorization is terminated or revoked sooner. Performed at [facility], [address] [place], [place] [address]            Radiological Exams on Admission: Personally reviewed CT head report, CXR report reviewed: Ct Head Wo Contrast  Result Date: 08/04/2018 CLINICAL DATA:  Pt sent from PCP office with his son who reports general decline in health over the past week. Denies cough, N/V/D. Pt is non verbal and has been non ambulatory for 1 week. Patient winces as if in pain when touched, patient unable to straighten head and neck per son, unsure if patient has had any type of injury, patient is covid +, no other complaints EXAM: CT HEAD WITHOUT CONTRAST CT CERVICAL SPINE WITHOUT CONTRAST TECHNIQUE: Multidetector CT imaging of the head and cervical spine was performed following the standard protocol without intravenous contrast. Multiplanar CT image reconstructions of the cervical spine were also generated. COMPARISON:  02/25/2012 FINDINGS: CT HEAD FINDINGS Brain: There is ventricular enlargement, greater than the sulci, raising suspicion for a chronic degree of hydrocephalus versus a predominance of central volume loss. Patchy periventricular white matter hypoattenuation is also noted consistent with mild chronic microvascular ischemic change. No parenchymal masses or mass effect, no evidence an infarct, no extra-axial masses or abnormal fluid collections and no intracranial hemorrhage. Vascular: No hyperdense vessel or unexpected calcification. Skull: Normal. Negative for fracture or focal lesion. Sinuses/Orbits: Globes and orbits are unremarkable. Sinuses and mastoid air cells  are clear. Other: None. CT CERVICAL SPINE FINDINGS Alignment: No spondylolisthesis.  Head is tilted to the left. Skull base and vertebrae: No acute fracture. No primary bone lesion or focal pathologic process. Soft tissues  and spinal canal: No prevertebral fluid or swelling. No visible canal hematoma. Disc levels: Mild loss of disc height at C3-C4, C4-C5 and C6-C7. No convincing disc herniation. No significant disc bulging. Upper chest: No mass or adenopathy.  No acute findings. Other: None. IMPRESSION: HEAD CT 1. No acute intracranial abnormalities. 2. Possible chronic hydrocephalus versus a predominance of central volume loss leading to ventriculomegaly. Mild chronic microvascular ischemic change. CERVICAL CT 1. No fracture or acute finding. Electronically Signed   By: Lajean Manes M.D.   On: 08/06/2018 14:54   Ct Cervical Spine Wo Contrast  Result Date: 07/21/2018 CLINICAL DATA:  Pt sent from PCP office with his son who reports general decline in health over the past week. Denies cough, N/V/D. Pt is non verbal and has been non ambulatory for 1 week. Patient winces as if in pain when touched, patient unable to straighten head and neck per son, unsure if patient has had any type of injury, patient is covid +, no other complaints EXAM: CT HEAD WITHOUT CONTRAST CT CERVICAL SPINE WITHOUT CONTRAST TECHNIQUE: Multidetector CT imaging of the head and cervical spine was performed following the standard protocol without intravenous contrast. Multiplanar CT image reconstructions of the cervical spine were also generated. COMPARISON:  02/25/2012 FINDINGS: CT HEAD FINDINGS Brain: There is ventricular enlargement, greater than the sulci, raising suspicion for a chronic degree of hydrocephalus versus a predominance of central volume loss. Patchy periventricular white matter hypoattenuation is also noted consistent with mild chronic microvascular ischemic change. No parenchymal masses or mass effect, no evidence an infarct,  no extra-axial masses or abnormal fluid collections and no intracranial hemorrhage. Vascular: No hyperdense vessel or unexpected calcification. Skull: Normal. Negative for fracture or focal lesion. Sinuses/Orbits: Globes and orbits are unremarkable. Sinuses and mastoid air cells are clear. Other: None. CT CERVICAL SPINE FINDINGS Alignment: No spondylolisthesis.  Head is tilted to the left. Skull base and vertebrae: No acute fracture. No primary bone lesion or focal pathologic process. Soft tissues and spinal canal: No prevertebral fluid or swelling. No visible canal hematoma. Disc levels: Mild loss of disc height at C3-C4, C4-C5 and C6-C7. No convincing disc herniation. No significant disc bulging. Upper chest: No mass or adenopathy.  No acute findings. Other: None. IMPRESSION: HEAD CT 1. No acute intracranial abnormalities. 2. Possible chronic hydrocephalus versus a predominance of central volume loss leading to ventriculomegaly. Mild chronic microvascular ischemic change. CERVICAL CT 1. No fracture or acute finding. Electronically Signed   By: Lajean Manes M.D.   On: 07/24/2018 14:54   Dg Chest Port 1 View  Result Date: 07/17/2018 CLINICAL DATA:  Pt is nonverbal,son states his health has gone downhill,no cough,being tested for covid EXAM: PORTABLE CHEST 1 VIEW COMPARISON:  None. FINDINGS: Cardiac silhouette is normal in size. No mediastinal or hilar masses. No evidence of adenopathy. Lung volumes are low. This leads to crowding of the bronchovascular structures at the bases. Allowing for this, the lungs are clear. No pleural effusion or pneumothorax. Skeletal structures are grossly intact. IMPRESSION: No active disease. Electronically Signed   By: Lajean Manes M.D.   On: 07/14/2018 12:07    EKG: Independently reviewed. Bifascicular block.  New from 2008 (only previous for comparison).  No ST changes.  Normal QRS, QT intervals.          Assessment/Plan  Acute metabolic encephalopathy on end  stage alzheimer's dementia There may be some degree of worsening from COVID, UTI and dehydration, but I suspect this is largely a  progression of his dementia.  So far, no improvement with fluids and antibiotics and steroids -Treat COVID, treat UTI, give IV fluids -If no improvement, will discuss Hospice with family.  Coronavirus infection, possibly mild COVID-19 Brief hypoxia in PCP's office, saturating well on ambient air here.  Normal CXR.   No indication for remdesivir given normal SpO2 -Lovenox for DVT PPx -Start zinc and Vitamin C -Start dexamethasone  Early sepsis from UTI Presnted with tachycardia, leukopenia, fever, and RR>20 in setting of UTI.  Urine culture growing staphylococcus coag negative. -Change antibiotics to vancomycin  Diabetes  Glucoses better, minmal PO intake -Hold home metformin -Start sliding scale corrections -If needed, will start Levemir tomorrow  Lymphopenia Mild, from coronavirus infection  Transaminitis  From coronavirus. -Trend LFTs  Chronic iron deficiency anemia Hgb normal -Continue iron  Severe protein calorie malnutrition He has lost almost 10 kg in last 3 months, and has sarcopenia and lost subcutaneous fat.  Suspect this is a result of his progressive end stage dementia.         DVT prophylaxis: Lovenox  Code Status: DO NOT RESUSCITATE, code status discussed and changed with son/POA  Family Communication: Sons by phone  Disposition Plan: Anticipate IV fluids, IV antibiotics and monitoring.  If worsening clinically, will plan for Hospice referral and transition back home. Consults called: None Admission status: INAPTIENT   At the time of admission, it appears that the appropriate admission status for this patient is INPATIENT. This is judged to be reasonable and necessary in order to provide the required intensity of service to ensure the patient's safety given: -presenting symptoms of changed mental status, inability to  stand -physical exam findings of tachycardia, tachypnea, fever, and  -initial radiographic and laboratory data SARS-CoV-2 infection, bacterial growth in urine culture, and hyperglycemia -in the context of their chronic comorbidities advanced dementia and diabetes    Together, these circumstances are felt to place him at high risk for further clinical deterioration threatening life, limb, or organ requiring a high intensity of service due to this acute illness that poses a threat to life, limb or bodily function.  I certify that at the point of admission it is my clinical judgment that the patient will require inpatient hospital care spanning beyond 2 midnights from the point of admission and that early discharge would result in unnecessary risk of decompensation and readmission or threat to life, limb or bodily function.   Medical decision making: Patient seen at 4:18 PM on 07/30/2018.  The patient was discussed with Dr. Ralene Bathe.  What exists of the patient's chart was reviewed in depth and summarized above.  Clinical condition: hemodynamically stable, mentation no change.        [facility] Pager: please page via [email or web address]

## 2018-07-31 LAB — CBC WITH DIFFERENTIAL/PLATELET
Abs Immature Granulocytes: 0.01 10*3/uL (ref 0.00–0.07)
Basophils Absolute: 0 10*3/uL (ref 0.0–0.1)
Basophils Relative: 0 %
Eosinophils Absolute: 0 10*3/uL (ref 0.0–0.5)
Eosinophils Relative: 0 %
HCT: 36.6 % — ABNORMAL LOW (ref 39.0–52.0)
Hemoglobin: 12.6 g/dL — ABNORMAL LOW (ref 13.0–17.0)
Immature Granulocytes: 0 %
Lymphocytes Relative: 27 %
Lymphs Abs: 0.9 10*3/uL (ref 0.7–4.0)
MCH: 29.3 pg (ref 26.0–34.0)
MCHC: 34.4 g/dL (ref 30.0–36.0)
MCV: 85.1 fL (ref 80.0–100.0)
Monocytes Absolute: 0.4 10*3/uL (ref 0.1–1.0)
Monocytes Relative: 10 %
Neutro Abs: 2.2 10*3/uL (ref 1.7–7.7)
Neutrophils Relative %: 63 %
Platelets: 227 10*3/uL (ref 150–400)
RBC: 4.3 MIL/uL (ref 4.22–5.81)
RDW: 14 % (ref 11.5–15.5)
WBC: 3.6 10*3/uL — ABNORMAL LOW (ref 4.0–10.5)
nRBC: 0 % (ref 0.0–0.2)

## 2018-07-31 LAB — C-REACTIVE PROTEIN: CRP: 1.5 mg/dL — ABNORMAL HIGH (ref <1.0)

## 2018-07-31 LAB — URINE CULTURE: Culture: >=100000 — AB

## 2018-07-31 LAB — COMPREHENSIVE METABOLIC PANEL
ALT: 72 U/L — ABNORMAL HIGH (ref 0–44)
AST: 58 U/L — ABNORMAL HIGH (ref 15–41)
Albumin: 3.2 g/dL — ABNORMAL LOW (ref 3.5–5.0)
Alkaline Phosphatase: 75 U/L (ref 38–126)
Anion gap: 13 (ref 5–15)
BUN: 13 mg/dL (ref 8–23)
CO2: 21 mmol/L — ABNORMAL LOW (ref 22–32)
Calcium: 9 mg/dL (ref 8.9–10.3)
Chloride: 102 mmol/L (ref 98–111)
Creatinine, Ser: 0.66 mg/dL (ref 0.61–1.24)
GFR calc Af Amer: >60 mL/min (ref >60)
GFR calc non Af Amer: >60 mL/min (ref >60)
Glucose, Bld: 190 mg/dL — ABNORMAL HIGH (ref 70–99)
Potassium: 3.8 mmol/L (ref 3.5–5.1)
Sodium: 136 mmol/L (ref 135–145)
Total Bilirubin: 0.5 mg/dL (ref 0.3–1.2)
Total Protein: 7.1 g/dL (ref 6.5–8.1)

## 2018-07-31 LAB — FERRITIN: Ferritin: 86 ng/mL (ref 24–336)

## 2018-07-31 LAB — D-DIMER, QUANTITATIVE: D-Dimer, Quant: 6.98 ug/mL-FEU — ABNORMAL HIGH (ref 0.00–0.50)

## 2018-07-31 LAB — GLUCOSE, CAPILLARY: Glucose-Capillary: 201 mg/dL — ABNORMAL HIGH (ref 70–99)

## 2018-07-31 LAB — ABO/RH: ABO/RH(D): A POS

## 2018-07-31 MED ORDER — ONDANSETRON HCL 4 MG/2ML IJ SOLN
4.0000 mg | Freq: Four times a day (QID) | INTRAMUSCULAR | Status: DC | PRN
  Filled 2018-07-31: qty 2

## 2018-07-31 MED ORDER — LORAZEPAM 2 MG/ML PO CONC
1.0000 mg | ORAL | Status: DC | PRN
  Filled 2018-07-31: qty 0.5

## 2018-07-31 MED ORDER — LORAZEPAM 2 MG/ML IJ SOLN
1.0000 mg | INTRAMUSCULAR | Status: DC | PRN
  Administered 2018-08-01: 1 mg via INTRAVENOUS
  Filled 2018-07-31: qty 1

## 2018-07-31 MED ORDER — POLYVINYL ALCOHOL 1.4 % OP SOLN
1.0000 [drp] | Freq: Four times a day (QID) | OPHTHALMIC | Status: DC | PRN
  Filled 2018-07-31: qty 15

## 2018-07-31 MED ORDER — ONDANSETRON 4 MG PO TBDP: 4.0000 mg | ORAL_TABLET | Freq: Four times a day (QID) | ORAL | Status: DC | PRN

## 2018-07-31 MED ORDER — CEFAZOLIN SODIUM-DEXTROSE 1-4 GM/50ML-% IV SOLN
1.0000 g | Freq: Three times a day (TID) | INTRAVENOUS | Status: DC
  Administered 2018-07-31 (×2): 1 g via INTRAVENOUS
  Filled 2018-07-31 (×3): qty 50

## 2018-07-31 MED ORDER — BIOTENE DRY MOUTH MT LIQD: 15.0000 mL | OROMUCOSAL | Status: DC | PRN

## 2018-07-31 MED ORDER — MORPHINE SULFATE (CONCENTRATE) 10 MG/0.5ML PO SOLN
5.0000 mg | ORAL | Status: DC | PRN
  Administered 2018-07-31 – 2018-08-11 (×12): 5 mg via SUBLINGUAL
  Filled 2018-07-31 (×12): qty 0.5

## 2018-07-31 MED ORDER — LORAZEPAM 0.5 MG PO TABS: 1.0000 mg | ORAL_TABLET | ORAL | Status: DC | PRN

## 2018-07-31 MED ORDER — MORPHINE SULFATE (CONCENTRATE) 10 MG/0.5ML PO SOLN: 5.0000 mg | ORAL | Status: DC | PRN

## 2018-07-31 NOTE — Progress Notes (Signed)
Condom cath changed with CNA. Gross hematuria present with clots evident. MD aware.

## 2018-07-31 NOTE — Progress Notes (Signed)
CSW received consult regarding residential hospice placement. CSW spoke with patient's son, Blake Wilcox, to confirm interest in [place]. CSW will fax referral for review and availability on Sunday.   [name] LCSW [phone]

## 2018-07-31 NOTE — Progress Notes (Signed)
PROGRESS NOTE    Blake LeschesWinston V Jordahl  ZOX:[id number] DOB: 01/18/1941 DOA: 07/23/2018 PCP: Wanda PlumpPaz, Jose E, MD      Brief Narrative:  Blake Wilcox is a 78 y.o. M with hx advanced dementia, home-dwelling, IDDM, and anemia who presented with 4 days confusion, decreased mentation.  All history collected from sons via phone due to patient's poor mentation.  Evidently, he was diagnosed with dementia in 2013, had a MMSE 18/30 as far ago as 2016 and 9/30 as long ago as Feb 2019.  In the last six months (per Neuro note in Mar 2020), he had progressed to needing total assist with toileting, feeding, "minimal verbal response", and pocketing pills.  Taken off Aricept/Namenda at that time.  In that context, family noted in the last week that he was even more weak than usual.  Mentation was declining.  He would not eat at all.  They deny fever, cough, dyspnea.  No focal weakness, no complaints of pain anywhere.    They brought him to the ER to be evaluated, and there he was hypoxic to 88% and sent downstairs.  In ER, CXR without opacity, SpO2 normal on room air.  CT head and C-spine unremarkable for stroke or fracture or IC bleeding.    SARS-CoV-2+, lymphopenia, mild transaminitis.  Lactate normal, troponin undetectable.  Creatinine normal, hyperglycemia.  Urine was "almost black in color" per EDP, and urinalysis showed many bacteria, white blood cells.  He was given ceftriaxone, IV fluids, the hospitalist service were asked to accept for admission for dehydration, UTI, and acute metabolic encephalopathy.        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  This appears to be an acute metabolic encephalopathy from COVID/fever.  It has not improved with fluid resuscitation or appropriate antibiotic treatment of his UTI.    Over the last 48 hours, he has been unresponsive to any stimuli, eats  only small mouthfuls of food, and makes no verbal responses to nursing or myself.    In [facility], I was told that the patient's PCP felt that the patient was Hospice appropriate, given his subacute decline from dementia prior to this illness, which appears confirmed from his note.    From my evaluation, the patient has lost verbal ability, has lost 20 lbs, has complete urinary incontinence.  He now has an acute illness that has worsened his mentation, and is taking no oral intake.  It is my opnion that in the absence of supplemental IV fluids, he will likely expire in days to weeks.  His terminal condition was reviewed with son Caryn BeeKevin, who expressed a family consensus that their father would have preferred compassionately withdrawing current cares and allowing a natural death. -Consult to Hospice     Sepsis from staph epi UTI Hematuria Presented with tachycardia, leukopenia, fever, and RR>20 in setting of UTI.  Urine culture growing staphylococcus epidermidis, oxacillin sensitive.    Hematuria due to UTI.  Other possible causes of hematuria include malignancy or stone.  There is no pain or abdominal rigidity to suggest perforation.  Family have expressed patient's clear wishes to focus on dignity and comfort in light of his terminal illness (advanced dementia) and treatment of stone or malignancy, even if they were present would not further those goals of care.  -Stop antibiotics -Stop fluids -Aggressive comfort measures  Coronavirus infection, possibly mild COVID-19 No further  hypoxia.    Diabetes  Comfort measures, stop glucose checks, corrections.  Lymphopenia Mild, from coronavirus infection  Transaminitis   Chronic iron deficiency anemia  Severe protein calorie malnutrition        MDM and disposition: The below labs and imaging reports were reviewed and summarized above.  Medication management as above.  The patient was admitted with dehydration  and sepsis from staph epi UTI.   He has not improved with IV FLuids and antibiotics, and appears to be actively dying.  Will refer to hospice to engage for residential or inpatient hospice death.    DVT prophylaxis: N/A comfort measures Code Status: DNR Family Communication: Son by phone    Consultants:   Hospice  Procedures:   None  Antimicrobials:   Ceftriaxone 6/19 >> 6/20  Vancomycin 6/20 x1  Cefazolin 6/21 x1  Culture data:   6/20 blood culture NGTD  6/19 urine culture -- staphylococcus epidermidis      Subjective: Fever yesterday afternoon.  No verbalizations.  Minimal spontaneous movements. Bed bound.  Incontinent.  Eats only few small mouthfuls of food. Objective: Vitals:   07/30/18 1600 07/31/18 0327 07/31/18 0801 07/31/18 1350  BP: (!) 143/88 (!) 139/95 136/90   Pulse: 84 (!) 110 94   Resp: 14 18    Temp: 97.8 F (36.6 C) 98.9 F (37.2 C) 99.3 F (37.4 C) 99.1 F (37.3 C)  TempSrc: Oral Oral Oral Axillary  SpO2: 97% 100% 96%   Weight:      Height:        Intake/Output Summary (Last 24 hours) at 07/31/2018 1604 Last data filed at 07/31/2018 1430 Gross per 24 hour  Intake 533.28 ml  Output 850 ml  Net -316.72 ml   Filed Weights   02-26-2018 1058 07/30/18 1106  Weight: 81.6 kg 77.6 kg    Examination: General appearance: frail elderly adult male, lethargic, does not open eyes to touch HEENT: Anicteric, conjunctiva pink, lids and lashes atrophic, dry. No nasal deformity, discharge, epistaxis.   Skin: Warm and dry.    Cardiac: RRR, nl S1-S2, no murmurs appreciated.  No LE edema. Respiratory: Normal respiratory rate and rhythm.  CTAB without rales or wheezes. Abdomen: Abdomen soft.  No grimace to palpations, no rigidity, no rebound or guarding.   MSK: Diffuse loss of subcutaneous muscle mass and fat. Neuro: Randomly moves right arm occasinoally, does not follow commands.    Psych: Obtunded    Data Reviewed: I have personally reviewed  following labs and imaging studies:  CBC: Recent Labs  Lab 02-26-2018 1148 07/30/18 0740 07/30/18 1510 07/31/18 0400  WBC 3.7* 3.7* 3.4* 3.6*  NEUTROABS 2.6 2.1 2.0 2.2  HGB 13.4 12.0* 13.0 12.6*  HCT 41.2 36.2* 38.9* 36.6*  MCV 88.4 87.2 87.2 85.1  PLT 243 240 245 227   Basic Metabolic Panel: Recent Labs  Lab 02-26-2018 1148 07/30/18 0740 07/30/18 1510 07/31/18 0400  NA 134* 137 138 136  K 4.2 4.2 4.0 3.8  CL 98 102 99 102  CO2 22 25 26  21*  GLUCOSE 249* 167* 162* 190*  BUN 20 16 14 13   CREATININE 0.86 0.80 0.77 0.66  CALCIUM 9.4 8.9 9.3 9.0   GFR: Estimated Creatinine Clearance: 87.6 mL/min (by C-G formula based on SCr of 0.66 mg/dL). Liver Function Tests: Recent Labs  Lab 02-26-2018 1148 07/30/18 1510 07/31/18 0400  AST 42* 47* 58*  ALT 54* 63* 72*  ALKPHOS 76 77 75  BILITOT 0.7 0.4 0.5  PROT 7.8 7.9  7.1  ALBUMIN 3.6 3.4* 3.2*   No results for input(s): LIPASE, AMYLASE in the last 168 hours. No results for input(s): AMMONIA in the last 168 hours. Coagulation Profile: No results for input(s): INR, PROTIME in the last 168 hours. Cardiac Enzymes: Recent Labs  Lab 08/08/2018 1148 07/30/18 1510  TROPONINI <0.03 <0.03   BNP (last 3 results) No results for input(s): PROBNP in the last 8760 hours. HbA1C: No results for input(s): HGBA1C in the last 72 hours. CBG: Recent Labs  Lab 07/30/18 0150 07/30/18 0557 07/30/18 1600 07/30/18 2158 07/31/18 1349  GLUCAP 145* 144* 156* 175* 201*   Lipid Profile: No results for input(s): CHOL, HDL, LDLCALC, TRIG, CHOLHDL, LDLDIRECT in the last 72 hours. Thyroid Function Tests: No results for input(s): TSH, T4TOTAL, FREET4, T3FREE, THYROIDAB in the last 72 hours. Anemia Panel: Recent Labs    07/30/18 1510 07/31/18 0400  FERRITIN 90 86   Urine analysis:    Component Value Date/Time   COLORURINE RED (A) 07/20/2018 1148   APPEARANCEUR TURBID (A) 07/13/2018 1148   LABSPEC  07/17/2018 1148    TEST NOT REPORTED DUE  TO COLOR INTERFERENCE OF URINE PIGMENT   PHURINE  08/04/2018 1148    TEST NOT REPORTED DUE TO COLOR INTERFERENCE OF URINE PIGMENT   GLUCOSEU (A) 07/18/2018 1148    TEST NOT REPORTED DUE TO COLOR INTERFERENCE OF URINE PIGMENT   HGBUR (A) 07/17/2018 1148    TEST NOT REPORTED DUE TO COLOR INTERFERENCE OF URINE PIGMENT   HGBUR negative 03/21/2010 0748   BILIRUBINUR (A) 07/15/2018 1148    TEST NOT REPORTED DUE TO COLOR INTERFERENCE OF URINE PIGMENT   KETONESUR (A) 07/15/2018 1148    TEST NOT REPORTED DUE TO COLOR INTERFERENCE OF URINE PIGMENT   PROTEINUR (A) 07/31/2018 1148    TEST NOT REPORTED DUE TO COLOR INTERFERENCE OF URINE PIGMENT   UROBILINOGEN 0.2 03/21/2010 0748   NITRITE (A) 08/05/2018 1148    TEST NOT REPORTED DUE TO COLOR INTERFERENCE OF URINE PIGMENT   LEUKOCYTESUR (A) 07/28/2018 1148    TEST NOT REPORTED DUE TO COLOR INTERFERENCE OF URINE PIGMENT   Sepsis Labs: @LABRCNTIP (procalcitonin:4,lacticacidven:4)  ) Recent Results (from the past 240 hour(s))  Urine culture     Status: Abnormal   Collection Time: 07/26/2018 11:25 AM   Specimen: Urine, Clean Catch  Result Value Ref Range Status   Specimen Description   Final    URINE, CLEAN CATCH Performed at [facility] [place], [address] [place], [place] [address]    Special Requests   Final    NONE Performed at [facility] [place], [address] [place], [place] [address]    Culture >=100,000 COLONIES/mL STAPHYLOCOCCUS EPIDERMIDIS (A)  Final   Report Status 07/31/2018 FINAL  Final   Organism ID, Bacteria STAPHYLOCOCCUS EPIDERMIDIS (A)  Final      Susceptibility   Staphylococcus epidermidis - MIC*    CIPROFLOXACIN >=8 RESISTANT Resistant     GENTAMICIN <=0.5 SENSITIVE Sensitive     NITROFURANTOIN <=16 SENSITIVE Sensitive     OXACILLIN <=0.25 SENSITIVE Sensitive     TETRACYCLINE 2 SENSITIVE Sensitive     VANCOMYCIN 1 SENSITIVE Sensitive     TRIMETH/SULFA <=10 SENSITIVE Sensitive     CLINDAMYCIN <=0.25  SENSITIVE Sensitive     RIFAMPIN <=0.5 SENSITIVE Sensitive     Inducible Clindamycin NEGATIVE Sensitive     * >=100,000 COLONIES/mL STAPHYLOCOCCUS EPIDERMIDIS  SARS Coronavirus 2 (Hosp order,Performed in [facility] lab via  [facility]ott ID)     Status: Abnormal   Collection Time: Aug 24, 2018 11:25 AM   Specimen: Dry Nasal Swab (Abbott ID Now)  Result Value Ref Range Status   SARS Coronavirus 2 (Abbott ID Now) POSITIVE (A) NEGATIVE Final    Comment: RESULT CALLED TO, READ BACK BY AND VERIFIED WITH: CINDY REED AT 1223 24-Aug-2018 BY K BARR (NOTE) SARS CoV 2 target nucleic acids are DETECTED.  The SARS-CoV-2 RNA is generally detectable in upper and lower respiratory specimens during the acute phase of infection. Positiveresults are indicative of active infection with SARS-CoV-2.   Clinical correlation with patient history and other diagnostic information is necessary to determine patient infection status.  Positive results do not rule out bacterial infection or coinfection with other viruses. The expected result is Negative. Fact Sheet for Patients: GolfingFamily.no Fact Sheet for Healthcare Providers: https://www.hernandez-brewer.com/ This test is not yet approved or cleared by the [place] FDA and  has been authorized for detection and/or diagnosis of SARS-CoV-2 by FDA under an Emergency Use Authorization (EUA).  This EUA will remain in effect (meaning this test can be used)  for the duration of  the COVID19 declaration under Section 564(b)(1) of the Act, 21 U.S.C.  section [phone] 3(b)(1), unless the authorization is terminated or revoked sooner. Performed at [facility], [address] [place], [place] [address]   Culture, blood (single)     Status: None (Preliminary result)   Collection Time: 07/30/18  5:11 PM   Specimen: BLOOD  Result Value Ref Range Status   Specimen Description   Final    BLOOD RIGHT ARM Performed at [place] [place] [address] [place], [place] [address]    Special Requests   Final    BOTTLES DRAWN AEROBIC ONLY Blood Culture adequate volume Performed at [facility] [address], [place], [place] [address]    Culture   Final    NO GROWTH < 24 HOURS Performed at [facility] [address] [place], [place] [address]    Report Status PENDING  Incomplete         Radiology Studies: No results found.      Scheduled Meds:  Continuous Infusions:    LOS: 2 days    Time spent: 25 minutes      Edwin Dada, MD [facility] 07/31/2018, 4:04 PM     Please page through [place]:  [email or web address] Password [email or web address] If 7PM-7AM, please contact night-coverage

## 2018-08-01 NOTE — Progress Notes (Signed)
PROGRESS NOTE    Blake LeschesWinston V Joles  ZOX:[id number] DOB: 1940/02/13 DOA: 05/28/2018 PCP: Wanda PlumpPaz, Jose E, MD      Brief Narrative:  Blake Wilcox is a 78 y.o. M with hx advanced dementia, home-dwelling, IDDM, and anemia who presented with 4 days confusion, decreased mentation.  All history collected from sons via phone due to patient's poor mentation.  Evidently, he was diagnosed with dementia in 2013, had a MMSE 18/30 as far ago as 2016 and 9/30 as long ago as Feb 2019.  In the last six months (per Neuro note in Mar 2020), he had progressed to needing total assist with toileting, feeding, "minimal verbal response", and pocketing pills.  Taken off Aricept/Namenda at that time.  In that context, family noted in the last week that he was even more weak than usual.  Mentation was declining.  He would not eat at all.  They deny fever, cough, dyspnea.  No focal weakness, no complaints of pain anywhere.    They brought him to the ER to be evaluated, and there he was hypoxic to 88% and sent downstairs.  In ER, CXR without opacity, SpO2 normal on room air.  CT head and C-spine unremarkable for stroke or fracture or IC bleeding.    SARS-CoV-2+, lymphopenia, mild transaminitis.  Lactate normal, troponin undetectable.  Creatinine normal, hyperglycemia.  Urine was "almost black in color" per EDP, and urinalysis showed many bacteria, white blood cells.  He was given ceftriaxone, IV fluids, the hospitalist service were asked to accept for admission for dehydration, UTI, and acute metabolic encephalopathy.        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 -Consult to Hospice for inpatient hospice transfer     Sepsis from staph epi UTI Hematuria -Comfort measures  Coronavirus infection, possibly mild COVID-19 No hypoxia  Diabetes   Lymphopenia  Transaminitis    Chronic iron deficiency anemia  Severe protein calorie malnutrition        MDM and disposition: The below labs and imaging reports were reviewed and summarized above.  Medication management as above.  The patient was admitted with dehydration and sepsis from staph epi UTI.   He has not improved with IV FLuids and antibiotics, and appears to be actively dying.  Will refer to hospice to engage for residential or inpatient hospice death.    DVT prophylaxis: N/A comfort measures Code Status: DNR Family Communication: Son by phone    Consultants:   Hospice  Procedures:   None  Antimicrobials:   Ceftriaxone 6/19 >> 6/20  Vancomycin 6/20 x1  Cefazolin 6/21 x1  Culture data:   6/20 blood culture NGTD  6/19 urine culture -- staphylococcus epidermidis      Subjective: No fever.  No change in mentation.  Now somnolent.     Objective: Vitals:   07/31/18 1350 07/31/18 1600 07/31/18 1955 08/01/18 0407  BP:  129/85 109/78 135/84  Pulse:  93 94 (!) 108  Resp:      Temp: 99.1 F (37.3 C)  (!) 100.6 F (38.1 C) 97.6 F (36.4 C)  TempSrc: Axillary  Oral Axillary  SpO2:  99% 98% 100%  Weight:      Height:        Intake/Output Summary (Last 24 hours) at 08/01/2018 0811 Last data filed at 07/31/2018 1700 Gross per 24 hour  Intake 527.33 ml  Output 200 ml  Net 327.33 ml  Filed Weights   28-May-2018 1058 07/30/18 1106  Weight: 81.6 kg 77.6 kg    Examination: General appearance: Frail elderly male, comatose HEENT:   Skin: Warm and dry Cardiac: RRR, no murmurs, no LE edema Respiratory: Unlabored.  No rales or wheezing Abdomen: Abdomen soft, no grimace MSK: Diffuse loss of subcutaneous muscle mass and fat. Neuro: No spontaneous movements, no verbalizations, does not open eyes. Psych: unable to assess    Data Reviewed: I have personally reviewed following labs and imaging studies:  CBC: Recent Labs  Lab 28-May-2018 1148 07/30/18 0740 07/30/18 1510  07/31/18 0400  WBC 3.7* 3.7* 3.4* 3.6*  NEUTROABS 2.6 2.1 2.0 2.2  HGB 13.4 12.0* 13.0 12.6*  HCT 41.2 36.2* 38.9* 36.6*  MCV 88.4 87.2 87.2 85.1  PLT 243 240 245 227   Basic Metabolic Panel: Recent Labs  Lab 28-May-2018 1148 07/30/18 0740 07/30/18 1510 07/31/18 0400  NA 134* 137 138 136  K 4.2 4.2 4.0 3.8  CL 98 102 99 102  CO2 22 25 26  21*  GLUCOSE 249* 167* 162* 190*  BUN 20 16 14 13   CREATININE 0.86 0.80 0.77 0.66  CALCIUM 9.4 8.9 9.3 9.0   GFR: Estimated Creatinine Clearance: 87.6 mL/min (by C-G formula based on SCr of 0.66 mg/dL). Liver Function Tests: Recent Labs  Lab 28-May-2018 1148 07/30/18 1510 07/31/18 0400  AST 42* 47* 58*  ALT 54* 63* 72*  ALKPHOS 76 77 75  BILITOT 0.7 0.4 0.5  PROT 7.8 7.9 7.1  ALBUMIN 3.6 3.4* 3.2*   No results for input(s): LIPASE, AMYLASE in the last 168 hours. No results for input(s): AMMONIA in the last 168 hours. Coagulation Profile: No results for input(s): INR, PROTIME in the last 168 hours. Cardiac Enzymes: Recent Labs  Lab 28-May-2018 1148 07/30/18 1510  TROPONINI <0.03 <0.03   BNP (last 3 results) No results for input(s): PROBNP in the last 8760 hours. HbA1C: No results for input(s): HGBA1C in the last 72 hours. CBG: Recent Labs  Lab 07/30/18 0150 07/30/18 0557 07/30/18 1600 07/30/18 2158 07/31/18 1349  GLUCAP 145* 144* 156* 175* 201*   Lipid Profile: No results for input(s): CHOL, HDL, LDLCALC, TRIG, CHOLHDL, LDLDIRECT in the last 72 hours. Thyroid Function Tests: No results for input(s): TSH, T4TOTAL, FREET4, T3FREE, THYROIDAB in the last 72 hours. Anemia Panel: Recent Labs    07/30/18 1510 07/31/18 0400  FERRITIN 90 86   Urine analysis:    Component Value Date/Time   COLORURINE RED (A) 17-Apr-202020 1148   APPEARANCEUR TURBID (A) 17-Apr-202020 1148   LABSPEC  17-Apr-202020 1148    TEST NOT REPORTED DUE TO COLOR INTERFERENCE OF URINE PIGMENT   PHURINE  17-Apr-202020 1148    TEST NOT REPORTED DUE TO COLOR  INTERFERENCE OF URINE PIGMENT   GLUCOSEU (A) 17-Apr-202020 1148    TEST NOT REPORTED DUE TO COLOR INTERFERENCE OF URINE PIGMENT   HGBUR (A) 17-Apr-202020 1148    TEST NOT REPORTED DUE TO COLOR INTERFERENCE OF URINE PIGMENT   HGBUR negative 03/21/2010 0748   BILIRUBINUR (A) 17-Apr-202020 1148    TEST NOT REPORTED DUE TO COLOR INTERFERENCE OF URINE PIGMENT   KETONESUR (A) 17-Apr-202020 1148    TEST NOT REPORTED DUE TO COLOR INTERFERENCE OF URINE PIGMENT   PROTEINUR (A) 17-Apr-202020 1148    TEST NOT REPORTED DUE TO COLOR INTERFERENCE OF URINE PIGMENT   UROBILINOGEN 0.2 03/21/2010 0748   NITRITE (A) 17-Apr-202020 1148    TEST NOT REPORTED DUE TO COLOR INTERFERENCE OF  URINE PIGMENT   LEUKOCYTESUR (A) 08/03/2018 1148    TEST NOT REPORTED DUE TO COLOR INTERFERENCE OF URINE PIGMENT   Sepsis Labs: @LABRCNTIP (procalcitonin:4,lacticacidven:4)  ) Recent Results (from the past 240 hour(s))  Urine culture     Status: Abnormal   Collection Time: 08/01/2018 11:25 AM   Specimen: Urine, Clean Catch  Result Value Ref Range Status   Specimen Description   Final    URINE, CLEAN CATCH Performed at [facility] [place], [address] [place], [place] [address]    Special Requests   Final    NONE Performed at [facility] [place], [address] [place], [place] [address]    Culture >=100,000 COLONIES/mL STAPHYLOCOCCUS EPIDERMIDIS (A)  Final   Report Status 07/31/2018 FINAL  Final   Organism ID, Bacteria STAPHYLOCOCCUS EPIDERMIDIS (A)  Final      Susceptibility   Staphylococcus epidermidis - MIC*    CIPROFLOXACIN >=8 RESISTANT Resistant     GENTAMICIN <=0.5 SENSITIVE Sensitive     NITROFURANTOIN <=16 SENSITIVE Sensitive     OXACILLIN <=0.25 SENSITIVE Sensitive     TETRACYCLINE 2 SENSITIVE Sensitive     VANCOMYCIN 1 SENSITIVE Sensitive     TRIMETH/SULFA <=10 SENSITIVE Sensitive     CLINDAMYCIN <=0.25 SENSITIVE Sensitive     RIFAMPIN <=0.5 SENSITIVE Sensitive     Inducible Clindamycin NEGATIVE Sensitive      * >=100,000 COLONIES/mL STAPHYLOCOCCUS EPIDERMIDIS  SARS Coronavirus 2 (Hosp order,Performed in [facility] lab via [facility] ID)     Status: Abnormal   Collection Time: 08/05/2018 11:25 AM   Specimen: Dry Nasal Swab (Abbott ID Now)  Result Value Ref Range Status   SARS Coronavirus 2 (Abbott ID Now) POSITIVE (A) NEGATIVE Final    Comment: RESULT CALLED TO, READ BACK BY AND VERIFIED WITH: CINDY REED AT 1223 08/05/2018 BY K BARR (NOTE) SARS CoV 2 target nucleic acids are DETECTED.  The SARS-CoV-2 RNA is generally detectable in upper and lower respiratory specimens during the acute phase of infection. Positiveresults are indicative of active infection with SARS-CoV-2.   Clinical correlation with patient history and other diagnostic information is necessary to determine patient infection status.  Positive results do not rule out bacterial infection or coinfection with other viruses. The expected result is Negative. Fact Sheet for Patients: http://www.graves-ford.org/https://www.fda.gov/media/136524/download Fact Sheet for Healthcare Providers: EnviroConcern.sihttps://www.fda.gov/media/136523/download This test is not yet approved or cleared by the [place] FDA and  has been authorized for detection and/or diagnosis of SARS-CoV-2 by FDA under an Emergency Use Authorization (EUA).  This EUA will remain in effect (meaning this test can be used)  for the duration of  the COVID19 declaration under Section 564(b)(1) of the Act, 21 U.S.C.  section [id number] 3(b)(1), unless the authorization is terminated or revoked sooner. Performed at [facility] [place], [address] [place], [place] [address]   Culture, blood (single)     Status: None (Preliminary result)   Collection Time: 07/30/18  5:11 PM   Specimen: BLOOD  Result Value Ref Range Status   Specimen Description   Final    BLOOD RIGHT ARM Performed at [facility], [address] [place], [place] [address]    Special Requests   Final    BOTTLES  DRAWN AEROBIC ONLY Blood Culture adequate volume Performed at [facility], [address] [place], [place] [address]    Culture   Final    NO GROWTH < 24 HOURS Performed at [facility], [place] [address] [place], [place] [address]    Report Status PENDING  Incomplete         Radiology Studies: No results found.      Scheduled Meds:  Continuous Infusions:    LOS: 3 days    Time spent: 15 miunutes      Edwin Dada, MD [facility] 08/01/2018, 8:11 AM     Please page through [place]:  [email or web address] Password [email or web address] If 7PM-7AM, please contact night-coverage

## 2018-08-02 MED ORDER — ACETAMINOPHEN 650 MG RE SUPP
650.0000 mg | RECTAL | Status: DC | PRN
  Administered 2018-08-02: 650 mg via RECTAL
  Filled 2018-08-02: qty 1

## 2018-08-02 MED ORDER — LIP MEDEX EX OINT
TOPICAL_OINTMENT | CUTANEOUS | Status: DC | PRN
  Filled 2018-08-02: qty 7

## 2018-08-02 NOTE — Care Management Important Message (Signed)
Important Message  Patient Details  Name: Blake Wilcox MRN: [id number] Date of Birth: 02-04-1941   Medicare Important Message Given:  Yes - Important Message mailed due to current National Emergency   Verbal consent obtained due to current National Emergency  Relationship to patient: Child Contact Name: Marijean Bravo Call Date: 08/02/18  Time: 4098 Phone: [phone] Outcome: Spoke with contact Important Message mailed to: Emergency contact on file     Orbie Pyo 08/02/2018, 3:46 PM

## 2018-08-02 NOTE — Social Work (Addendum)
5:05pm- Pt son cannot provide 24/7 assistance at this time; home is not a feasible option continue to await bed at [facility].  3:23pm- Spoke with pt son [name] at MD request. Pt son had questions about bringing pt home with home hospice. We discussed that family must ensure that pt could have 24/7 physical assistance. Pt son made comment that he is continuing to work and that pt wife cant physically assist. CSW discussed that home hospice may not be feasible without this, however CSW did make referral to Lynn at [place] to discuss home hospice option. Pt son knows that if this is not feasible that we will continue to await bed at [facility] for inpatient hospice.   12:37pm- CSW f/u with referrals that had been made for hospice placement.  Pt family preferred [place] however hospice home currently unable to support COVID + patients. Referral was made to [facility] [place] f/u with liaison Harmon Pier and there currently is no availability.   CSW continuing to follow for support with disposition when hospice bed available.   Westley Hummer, MSW, [place] Work [phone]

## 2018-08-02 NOTE — Progress Notes (Signed)
RN spoke with son [name] to arrange a family visit for 6-23 at noon for one family member to visit.

## 2018-08-02 NOTE — Progress Notes (Signed)
[place] ([place]  Received request for family interest in [place] services from [place] on 08/01/2018. Unfortunately [facility] is not able to offer a room at this time. Attempted contact with son [name]. Left voice message requesting call back. Spoke with CSW Michiel Cowboy this morning. Michiel Cowboy is aware [facility] is not able to offer a room at this time. Will continue to follow and update CSW as availability changes.   Please do not hesitate to call with questions.   Thank you,  Erling Conte, LCSW [phone]  [facility] hospital liaisons are listed daily under Hospice and [place].

## 2018-08-03 LAB — GLUCOSE, CAPILLARY: Glucose-Capillary: 180 mg/dL — ABNORMAL HIGH (ref 70–99)

## 2018-08-03 MED ORDER — GLYCOPYRROLATE 0.2 MG/ML IJ SOLN
0.2000 mg | INTRAMUSCULAR | Status: DC | PRN
  Administered 2018-08-11: 0.2 mg via INTRAVENOUS
  Filled 2018-08-03 (×2): qty 1

## 2018-08-03 MED ORDER — GLYCOPYRROLATE 0.2 MG/ML IJ SOLN: 0.2000 mg | INTRAMUSCULAR | Status: DC | PRN

## 2018-08-03 MED ORDER — GLYCOPYRROLATE 1 MG PO TABS
1.0000 mg | ORAL_TABLET | ORAL | Status: DC | PRN
  Filled 2018-08-03: qty 1

## 2018-08-03 MED ORDER — SCOPOLAMINE 1 MG/3DAYS TD PT72
1.0000 | MEDICATED_PATCH | TRANSDERMAL | Status: DC | PRN
  Administered 2018-08-04: 1.5 mg via TRANSDERMAL
  Filled 2018-08-03 (×3): qty 1

## 2018-08-03 NOTE — Progress Notes (Signed)
[place] [address] [facility] [place]  Unfortunately, [facility] is not able to offer a room at this time. Contacted son [name] to notify. He states a bed was offered in [place] but he feels that is too far for him to commute with the patient's wife. We did discuss [facility] [place] if they were to have a bed become available and he still feels that would be too far as well. He requests to remain on the list for [facility].  Please call with any questions or concerns.  Thank you, Margaretmary Eddy, RN, BSN [phone]  [facility] hospital liaisons are listed daily under Hospice and [place]

## 2018-08-03 NOTE — Progress Notes (Signed)
PROGRESS NOTE    Blake Wilcox  FTD:[id number] DOB: June 03, 1940 DOA: 2018/08/26 PCP: Colon Branch, MD      Brief Narrative:  Blake Wilcox is a 78 y.o. M with hx advanced dementia, home-dwelling, IDDM, and anemia who presented with 4 days confusion, decreased mentation.  All history collected from sons via phone due to patient's poor mentation.  Evidently, he was diagnosed with dementia in 2013, had a MMSE 18/30 as far ago as 2016 and 9/30 as long ago as Feb 2019.  In the last six months (per Neuro note in Mar 2020), he had progressed to needing total assist with toileting, feeding, "minimal verbal response", and pocketing pills.  Taken off Aricept/Namenda at that time.  In that context, family noted in the last week that he was even more weak than usual.  Mentation was declining.  He would not eat at all.  They deny fever, cough, dyspnea.  No focal weakness, no complaints of pain anywhere.    They brought him to the ER to be evaluated, and there he was hypoxic to 88% and sent downstairs.  In ER, CXR without opacity, SpO2 normal on room air.  CT head and C-spine unremarkable for stroke or fracture or IC bleeding.    SARS-CoV-2+, lymphopenia, mild transaminitis.  Lactate normal, troponin undetectable.  Creatinine normal, hyperglycemia.  Urine was "almost black in color" per EDP, and urinalysis showed many bacteria, white blood cells.  He was given ceftriaxone, IV fluids, the hospitalist service were asked to accept for admission for dehydration, UTI, and acute metabolic encephalopathy.        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  Medication management as above.  The patient was admitted with dehydration and sepsis from staph epi UTI.   He has not improved with IV FLuids and antibiotics, and appears to be actively dying.  Plan for inpatient Hospice.  This is being coordinated by SW.    DVT prophylaxis: N/A comfort measures Code Status: DNR Family Communication: Son by phone    Consultants:   Hospice  Procedures:   None  Antimicrobials:   Ceftriaxone 6/19 >> 6/20  Vancomycin 6/20 x1  Cefazolin 6/21 x1  Culture data:   6/20 blood culture NGTD  6/19 urine culture -- staphylococcus epidermidis      Subjective: No new change. No respiratory distress.   Objective:  08/01/18 0407 08/01/18 2000 08/02/18 0752  BP: 135/84 116/76 139/88  Pulse: (!) 108 (!) 122 (!) 108  Resp:  (!) 22 (!) 33  Temp: 97.6 F (36.4 C) (!) 102.9 F (39.4 C) (!) 101.3 F (38.5 C)  TempSrc: Axillary Axillary Axillary  SpO2: 100% 100% 98%  Weight:     Height:        Examination: General appearance: Elderly male, unresponsive to touch, sluggish HEENT:   Skin: Warm and dry Cardiac: Tachycardic, regular, no murmurs, no LE edema Respiratory: Tachypneic, no rales, diminished Abdomen: Abdomen soft, no response to palpation MSK: Diffuse loss of subcutaneous muscle mass and fat. Neuro: Obtunded.  Does not respond to voice or touch.    Psych: Attention diminished.    Data Reviewed: I have personally reviewed following labs and imaging studies:  CBC: Recent Labs  Lab 07/31/2018 1148 07/30/18 0740 07/30/18 1510 07/31/18 0400  WBC 3.7* 3.7* 3.4* 3.6*  NEUTROABS 2.6 2.1 2.0 2.2  HGB 13.4 12.0* 13.0 12.6*  HCT 41.2 36.2* 38.9* 36.6*  MCV 88.4 87.2 87.2 85.1  PLT 243 240 245 227   Basic Metabolic Panel: Recent Labs  Lab 07/28/2018 1148 07/30/18 0740 07/30/18 1510 07/31/18  0400  NA 134* 137 138 136  K 4.2 4.2 4.0 3.8  CL 98 102 99 102  CO2 22 25 26  21*  GLUCOSE 249* 167* 162* 190*  BUN 20 16 14 13   CREATININE 0.86 0.80 0.77 0.66  CALCIUM 9.4 8.9 9.3 9.0   GFR: Estimated Creatinine Clearance: 87.6 mL/min (by C-G formula based on SCr of 0.66 mg/dL). Liver Function Tests: Recent Labs  Lab 07/22/2018 1148 07/30/18 1510 07/31/18 0400  AST 42* 47* 58*  ALT 54* 63* 72*  ALKPHOS 76 77 75  BILITOT 0.7 0.4 0.5  PROT 7.8 7.9 7.1  ALBUMIN 3.6 3.4* 3.2*   No results for input(s): LIPASE, AMYLASE in the last 168 hours. No results for input(s): AMMONIA in the last 168 hours. Coagulation Profile: No results for input(s): INR, PROTIME in the last 168 hours. Cardiac Enzymes: Recent Labs  Lab 07/21/2018 1148 07/30/18 1510  TROPONINI <0.03 <0.03   BNP (last 3 results) No results for input(s): PROBNP in the last 8760 hours. HbA1C: No results for input(s): HGBA1C in the last 72 hours. CBG: Recent Labs  Lab 07/30/18 0150 07/30/18 0557 07/30/18 1600 07/30/18 2158 07/31/18 1349  GLUCAP 145* 144* 156* 175* 201*   Lipid Profile: No results for input(s): CHOL, HDL, LDLCALC, TRIG, CHOLHDL, LDLDIRECT in the last 72 hours. Thyroid Function Tests: No results for input(s): TSH, T4TOTAL, FREET4, T3FREE, THYROIDAB in the last 72 hours. Anemia Panel: No results for input(s): VITAMINB12, FOLATE, FERRITIN, TIBC, IRON, RETICCTPCT in the last 72 hours. Urine analysis:    Component Value Date/Time   COLORURINE RED (A) 07/25/2018 1148   APPEARANCEUR TURBID (A) 08/02/2018 1148   LABSPEC  08/07/2018 1148    TEST NOT REPORTED DUE TO COLOR INTERFERENCE OF URINE PIGMENT   PHURINE  07/19/2018 1148    TEST NOT REPORTED DUE TO COLOR INTERFERENCE OF URINE PIGMENT   GLUCOSEU (A) 07/21/2018 1148    TEST NOT REPORTED DUE TO COLOR INTERFERENCE OF URINE PIGMENT   HGBUR (A) 07/15/2018 1148    TEST NOT REPORTED DUE TO COLOR INTERFERENCE OF URINE PIGMENT   HGBUR negative  03/21/2010 0748   BILIRUBINUR (A) 07/28/2018 1148    TEST NOT REPORTED DUE TO COLOR INTERFERENCE OF URINE PIGMENT   KETONESUR (A) 07/14/2018 1148    TEST NOT REPORTED DUE TO COLOR INTERFERENCE OF URINE PIGMENT   PROTEINUR (A) 07/24/2018 1148    TEST NOT REPORTED DUE TO COLOR INTERFERENCE OF URINE PIGMENT   UROBILINOGEN 0.2 03/21/2010 0748   NITRITE (A) 07/27/2018 1148    TEST NOT REPORTED DUE TO COLOR INTERFERENCE OF URINE PIGMENT   LEUKOCYTESUR (A) 07/27/2018 1148    TEST NOT REPORTED DUE TO COLOR INTERFERENCE OF URINE PIGMENT   Sepsis Labs: @LABRCNTIP (procalcitonin:4,lacticacidven:4)  ) Recent Results (from the past 240 hour(s))  Urine culture     Status: Abnormal   Collection Time: 08/07/2018 11:25 AM   Specimen: Urine, Clean Catch  Result Value  Ref Range Status   Specimen Description   Final    URINE, CLEAN CATCH Performed at [facility] [place], [address] [place], [place] [address]    Special Requests   Final    NONE Performed at [facility] [place], [address] [place], [place] [address]    Culture >=100,000 COLONIES/mL STAPHYLOCOCCUS EPIDERMIDIS (A)  Final   Report Status 07/31/2018 FINAL  Final   Organism ID, Bacteria STAPHYLOCOCCUS EPIDERMIDIS (A)  Final      Susceptibility   Staphylococcus epidermidis - MIC*    CIPROFLOXACIN >=8 RESISTANT Resistant     GENTAMICIN <=0.5 SENSITIVE Sensitive     NITROFURANTOIN <=16 SENSITIVE Sensitive     OXACILLIN <=0.25 SENSITIVE Sensitive     TETRACYCLINE 2 SENSITIVE Sensitive     VANCOMYCIN 1 SENSITIVE Sensitive     TRIMETH/SULFA <=10 SENSITIVE Sensitive     CLINDAMYCIN <=0.25 SENSITIVE Sensitive     RIFAMPIN <=0.5 SENSITIVE Sensitive     Inducible Clindamycin NEGATIVE Sensitive     * >=100,000 COLONIES/mL STAPHYLOCOCCUS EPIDERMIDIS  SARS Coronavirus 2 (Hosp order,Performed in [facility] lab via Abbott ID)     Status: Abnormal   Collection Time: 08/03/2018 11:25 AM   Specimen: Dry Nasal Swab (Abbott ID Now)   Result Value Ref Range Status   SARS Coronavirus 2 (Abbott ID Now) POSITIVE (A) NEGATIVE Final    Comment: RESULT CALLED TO, READ BACK BY AND VERIFIED WITH: CINDY REED AT 1223 07/19/2018 BY K BARR (NOTE) SARS CoV 2 target nucleic acids are DETECTED.  The SARS-CoV-2 RNA is generally detectable in upper and lower respiratory specimens during the acute phase of infection. Positiveresults are indicative of active infection with SARS-CoV-2.   Clinical correlation with patient history and other diagnostic information is necessary to determine patient infection status.  Positive results do not rule out bacterial infection or coinfection with other viruses. The expected result is Negative. Fact Sheet for Patients: http://www.graves-ford.org/https://www.fda.gov/media/136524/download Fact Sheet for Healthcare Providers: EnviroConcern.sihttps://www.fda.gov/media/136523/download This test is not yet approved or cleared by the [place] FDA and  has been authorized for detection and/or diagnosis of SARS-CoV-2 by FDA under an Emergency Use Authorization (EUA).  This EUA will remain in effect (meaning this test can be used)  for the duration of  the COVID19 declaration under Section 564(b)(1) of the Act, 21 U.S.C.  section [id number] 3(b)(1), unless the authorization is terminated or revoked sooner. Performed at [facility] [place], [address] [place], [place] [address]   Culture, blood (single)     Status: None (Preliminary result)   Collection Time: 07/30/18  5:11 PM   Specimen: BLOOD  Result Value Ref Range Status   Specimen Description   Final    BLOOD RIGHT ARM Performed at [facility], [address] [place], [place] [address]    Special Requests   Final    BOTTLES DRAWN AEROBIC ONLY Blood Culture adequate volume Performed at [facility], [address] [place], [place] [address]    Culture   Final    NO GROWTH 3 DAYS Performed at [facility], [address]  [place], [place] [address]    Report Status PENDING  Incomplete         Radiology Studies: No results found.      Scheduled Meds:  Continuous Infusions:    LOS: 4 days    Time spent: 15 miunutes      Dreux Mcgroarty P  Maryfrances Bunnellanford, MD [facility] 08/02/2018, 1:00 PM    Please page through AMION:  [email or web address] Password [email or web address] If 7PM-7AM, please contact night-coverage

## 2018-08-03 NOTE — Progress Notes (Signed)
PROGRESS NOTE    Blake Wilcox  SJG:[id number] DOB: July 16, 1940 DOA: 07/22/2018 PCP: Wanda PlumpPaz, Jose E, MD      Brief Narrative:  Blake Wilcox is a 78 y.o. M with hx advanced dementia, home-dwelling, IDDM, and anemia who presented with 4 days confusion, decreased mentation.  All history collected from sons via phone due to patient's poor mentation.  Evidently, he was diagnosed with dementia in 2013, had a MMSE 18/30 as far ago as 2016 and 9/30 as long ago as Feb 2019.  In the last six months (per Neuro note in Mar 2020), he had progressed to needing total assist with toileting, feeding, "minimal verbal response", and pocketing pills.  Taken off Aricept/Namenda at that time.  In that context, family noted in the last week that he was even more weak than usual.  Mentation was declining.  He would not eat at all.  They deny fever, cough, dyspnea.  No focal weakness, no complaints of pain anywhere.    They brought him to the ER to be evaluated, and there he was hypoxic to 88% and sent downstairs.  In ER, CXR without opacity, SpO2 normal on room air.  CT head and C-spine unremarkable for stroke or fracture or IC bleeding.    SARS-CoV-2+, lymphopenia, mild transaminitis.  Lactate normal, troponin undetectable.  Creatinine normal, hyperglycemia.  Urine was "almost black in color" per EDP, and urinalysis showed many bacteria, white blood cells.  He was given ceftriaxone, IV fluids, the hospitalist service were asked to accept for admission for dehydration, UTI, and acute metabolic encephalopathy.        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  He has some respiratory rattle today.  Family were able to visit today. -Plan for inpatient Hospice at discharge -Robinul for secretions, scopolamine if ineffective    Sepsis from staph epi UTI Hematuria Fever overnight -Full comfort measures  Coronavirus infection,  possibly mild COVID-19 No hypoxia  Diabetes   Lymphopenia  Transaminitis   Chronic iron deficiency anemia  Severe protein calorie malnutrition        MDM and disposition: The below labs and imaging medication management as above.  The patient was admitted with dehydration and sepsis from staph epi UTI.   He has not improved with IV FLuids and antibiotics, and appears to be actively dying.  Plan for inpatient Hospice, pending bed.  This is being coordinated by SW.    DVT prophylaxis: N/A comfort measures Code Status: DNR Family Communication:      Consultants:   Hospice  Procedures:   None  Antimicrobials:   Ceftriaxone 6/19 >> 6/20  Vancomycin 6/20 x1  Cefazolin 6/21 x1  Culture data:   6/20 blood culture NGTD  6/19 urine culture -- staphylococcus epidermidis      Subjective: No improvement.  Opens eyes for me today.  Fever overnight.   Objective:  08/01/18 0407 08/01/18 2000 08/02/18 0752  BP: 135/84 116/76 139/88  Pulse: (!) 108 (!) 122 (!) 108  Resp:  (!) 22 (!) 33  Temp: 97.6 F (36.4 C) (!) 102.9 F (39.4 C) (!) 101.3 F (38.5 C)  TempSrc: Axillary Axillary Axillary  SpO2: 100% 100% 98%  Weight:     Height:        Examination: General appearance: Elderly male, opens eyes to touch, then closes them again, sluggish. HEENT:   Skin: Warm and dry Cardiac: Tachycardic, regular, no murmurs, no lower extremity edema.  Respiratory: Tachypneic but unlabored, no rales, diminished throughout. Abdomen: Abdomen soft, no response to palpation MSK: Diffuse loss of subcutaneous muscle mass and fat. Neuro: Obtunded.      Psych: Attention diminished.    Data Reviewed: I have personally reviewed following labs and imaging studies:  CBC: Recent Labs  Lab 14-Oct-2018 1148 07/30/18 0740 07/30/18 1510 07/31/18 0400  WBC 3.7* 3.7* 3.4* 3.6*  NEUTROABS 2.6 2.1 2.0 2.2  HGB 13.4 12.0* 13.0 12.6*  HCT 41.2 36.2* 38.9* 36.6*  MCV 88.4  87.2 87.2 85.1  PLT 243 240 245 227   Basic Metabolic Panel: Recent Labs  Lab 14-Oct-2018 1148 07/30/18 0740 07/30/18 1510 07/31/18 0400  NA 134* 137 138 136  K 4.2 4.2 4.0 3.8  CL 98 102 99 102  CO2 22 25 26  21*  GLUCOSE 249* 167* 162* 190*  BUN 20 16 14 13   CREATININE 0.86 0.80 0.77 0.66  CALCIUM 9.4 8.9 9.3 9.0   GFR: Estimated Creatinine Clearance: 87.6 mL/min (by C-G formula based on SCr of 0.66 mg/dL). Liver Function Tests: Recent Labs  Lab 14-Oct-2018 1148 07/30/18 1510 07/31/18 0400  AST 42* 47* 58*  ALT 54* 63* 72*  ALKPHOS 76 77 75  BILITOT 0.7 0.4 0.5  PROT 7.8 7.9 7.1  ALBUMIN 3.6 3.4* 3.2*   No results for input(s): LIPASE, AMYLASE in the last 168 hours. No results for input(s): AMMONIA in the last 168 hours. Coagulation Profile: No results for input(s): INR, PROTIME in the last 168 hours. Cardiac Enzymes: Recent Labs  Lab 14-Oct-2018 1148 07/30/18 1510  TROPONINI <0.03 <0.03   BNP (last 3 results) No results for input(s): PROBNP in the last 8760 hours. HbA1C: No results for input(s): HGBA1C in the last 72 hours. CBG: Recent Labs  Lab 07/30/18 0557 07/30/18 1600 07/30/18 2158 07/31/18 0645 07/31/18 1349  GLUCAP 144* 156* 175* 180* 201*   Lipid Profile: No results for input(s): CHOL, HDL, LDLCALC, TRIG, CHOLHDL, LDLDIRECT in the last 72 hours. Thyroid Function Tests: No results for input(s): TSH, T4TOTAL, FREET4, T3FREE, THYROIDAB in the last 72 hours. Anemia Panel: No results for input(s): VITAMINB12, FOLATE, FERRITIN, TIBC, IRON, RETICCTPCT in the last 72 hours. Urine analysis:    Component Value Date/Time   COLORURINE RED (A) 03-Sep-202020 1148   APPEARANCEUR TURBID (A) 03-Sep-202020 1148   LABSPEC  03-Sep-202020 1148    TEST NOT REPORTED DUE TO COLOR INTERFERENCE OF URINE PIGMENT   PHURINE  03-Sep-202020 1148    TEST NOT REPORTED DUE TO COLOR INTERFERENCE OF URINE PIGMENT   GLUCOSEU (A) 03-Sep-202020 1148    TEST NOT REPORTED DUE TO COLOR  INTERFERENCE OF URINE PIGMENT   HGBUR (A) 03-Sep-202020 1148    TEST NOT REPORTED DUE TO COLOR INTERFERENCE OF URINE PIGMENT   HGBUR negative 03/21/2010 0748   BILIRUBINUR (A) 03-Sep-202020 1148    TEST NOT REPORTED DUE TO COLOR INTERFERENCE OF URINE PIGMENT   KETONESUR (A) 03-Sep-202020 1148    TEST NOT REPORTED DUE TO COLOR INTERFERENCE OF URINE PIGMENT   PROTEINUR (A) 03-Sep-202020 1148    TEST NOT REPORTED DUE TO COLOR INTERFERENCE OF URINE PIGMENT   UROBILINOGEN 0.2 03/21/2010 0748   NITRITE (A) 03-Sep-202020 1148    TEST NOT REPORTED DUE TO COLOR INTERFERENCE OF URINE PIGMENT   LEUKOCYTESUR (A) 03-Sep-202020 1148    TEST NOT REPORTED DUE TO COLOR INTERFERENCE OF URINE PIGMENT   Sepsis Labs: @LABRCNTIP (procalcitonin:4,lacticacidven:4)  ) Recent Results (from the past 240 hour(s))  Urine culture  Status: Abnormal   Collection Time: 07/26/2018 11:25 AM   Specimen: Urine, Clean Catch  Result Value Ref Range Status   Specimen Description   Final    URINE, CLEAN CATCH Performed at [facility] [place], [address] [place], [place] [address]    Special Requests   Final    NONE Performed at [facility] [place], [address] [place], [place] [address]    Culture >=100,000 COLONIES/mL STAPHYLOCOCCUS EPIDERMIDIS (A)  Final   Report Status 07/31/2018 FINAL  Final   Organism ID, Bacteria STAPHYLOCOCCUS EPIDERMIDIS (A)  Final      Susceptibility   Staphylococcus epidermidis - MIC*    CIPROFLOXACIN >=8 RESISTANT Resistant     GENTAMICIN <=0.5 SENSITIVE Sensitive     NITROFURANTOIN <=16 SENSITIVE Sensitive     OXACILLIN <=0.25 SENSITIVE Sensitive     TETRACYCLINE 2 SENSITIVE Sensitive     VANCOMYCIN 1 SENSITIVE Sensitive     TRIMETH/SULFA <=10 SENSITIVE Sensitive     CLINDAMYCIN <=0.25 SENSITIVE Sensitive     RIFAMPIN <=0.5 SENSITIVE Sensitive     Inducible Clindamycin NEGATIVE Sensitive     * >=100,000 COLONIES/mL STAPHYLOCOCCUS EPIDERMIDIS  SARS Coronavirus 2 (Hosp  order,Performed in [facility] lab via Abbott ID)     Status: Abnormal   Collection Time: 07/30/2018 11:25 AM   Specimen: Dry Nasal Swab (Abbott ID Now)  Result Value Ref Range Status   SARS Coronavirus 2 (Abbott ID Now) POSITIVE (A) NEGATIVE Final    Comment: RESULT CALLED TO, READ BACK BY AND VERIFIED WITH: CINDY REED AT 1223 07/15/2018 BY K BARR (NOTE) SARS CoV 2 target nucleic acids are DETECTED.  The SARS-CoV-2 RNA is generally detectable in upper and lower respiratory specimens during the acute phase of infection. Positiveresults are indicative of active infection with SARS-CoV-2.   Clinical correlation with patient history and other diagnostic information is necessary to determine patient infection status.  Positive results do not rule out bacterial infection or coinfection with other viruses. The expected result is Negative. Fact Sheet for Patients: http://www.graves-ford.org/https://www.fda.gov/media/136524/download Fact Sheet for Healthcare Providers: EnviroConcern.sihttps://www.fda.gov/media/136523/download This test is not yet approved or cleared by the [place] FDA and  has been authorized for detection and/or diagnosis of SARS-CoV-2 by FDA under an Emergency Use Authorization (EUA).  This EUA will remain in effect (meaning this test can be used)  for the duration of  the COVID19 declaration under Section 564(b)(1) of the Act, 21 U.S.C.  section [id number] 3(b)(1), unless the authorization is terminated or revoked sooner. Performed at [facility] [place], [address] [place], [place] [address]   Culture, blood (single)     Status: None (Preliminary result)   Collection Time: 07/30/18  5:11 PM   Specimen: BLOOD  Result Value Ref Range Status   Specimen Description   Final    BLOOD RIGHT ARM Performed at [facility], [address] [place], [place] [address]    Special Requests   Final    BOTTLES DRAWN AEROBIC ONLY Blood Culture adequate volume Performed at [facility], [address] [place], [place] [address]    Culture   Final    NO GROWTH 4 DAYS Performed at [facility], [address] [place], [place] [address]    Report Status PENDING  Incomplete         Radiology Studies: No results found.      Scheduled Meds:  Continuous Infusions:  Time spent: 15 minutes      Edwin Dada, MD [facility] 08/03/2018 , 1:00 PM    Please page through [place]:  [email or web address] Password [email or web address] If 7PM-7AM, please contact night-coverage

## 2018-08-03 NOTE — Progress Notes (Addendum)
No beds at [facility] as of this morning.   CSW updated son Blake Wilcox. CSW also proposed of [facility] in [place] has a bed would he be open to this, he reports no that he has a preference for [facility] and would like to wait until a bed becomes available at [facility].   CSW will continue to follow up.   [place], [place]

## 2018-08-04 ENCOUNTER — Ambulatory Visit: Payer: Medicare Other | Admitting: Internal Medicine

## 2018-08-04 LAB — CULTURE, BLOOD (SINGLE)
Culture: NO GROWTH
Special Requests: ADEQUATE

## 2018-08-04 NOTE — Progress Notes (Signed)
PROGRESS NOTE  Blake Wilcox  UVO:[id number] DOB: 04/05/1940 DOA: 07/19/2018 PCP: Colon Branch, MD   Brief Narrative: Blake Wilcox is a 78 y.o.Mwith hx advanced dementia, home-dwelling, IDDM, and anemia who presented with 4 days of confusion, decreased mentation.  All history collected from sons via phone due to patient's poor mentation. Evidently, he was diagnosed with dementia in 2013, had a MMSE 18/30 as far ago as 2016 and 9/30 as long ago as Feb 2019. In the last six months (per Neuro note in Mar 2020), he had progressed to needing total assist with toileting, feeding, "minimal verbal response", and pocketing pills. Taken off Aricept/Namenda at that time.  In that context, family noted in the last week that he was even more weak than usual. Mentation was declining. He would not eat at all. They deny fever, cough, dyspnea. No focal weakness, no complaints of pain anywhere.   They brought him to the ER to be evaluated, and there he was hypoxic to 88%, CXR without opacity. CT head and C-spine unremarkable for stroke or fracture or IC bleeding.   SARS-CoV-2+, lymphopenia, mild transaminitis. Lactate normal, troponin undetectable. Creatinine normal, hyperglycemia. Urine was "almost black in color" per EDP, and urinalysis showed many bacteria, white blood cells. He was given ceftriaxone, IV fluids, the hospitalist service were asked to acceptfor admission for dehydration, UTI, and acute metabolic encephalopathy. He was treated supportively without improvement and decision was made to pursue hospice services, currently awaiting bed availability.   Assessment & Plan: Principal Problem:   Acute metabolic encephalopathy Active Problems:   ANEMIA-NOS   Dementia (HCC)   Diabetes ([place])   COVID-19 virus infection   Dehydration   Acute lower UTI  Acute metabolic encephalopathy on chronic advanced dementia: Due to infection.  - Continue comfort medications as  ordered.  Covid-19 infection: No pneumonia. This is causing transaminitis and lymphopenia.  - Supportive care  Sepsis due to UTI due to S. epidermidis: Based on culture data.  - Comfort measures  Severe protein calorie malnutrition:  - Can give supplement if he desires, but currently not taking po.  Chronic iron deficiency anemia:  - No interventions planned.   T2DM:   DVT prophylaxis: Comfort measures Code Status: DNR Family Communication: None at bedside. Family was able to visit earlier Disposition Plan: [facility] once bed available. None today.  Consultants:   Hospice  Procedures:   None  Antimicrobials:  Ceftriaxone 6/19 >> 6/20  Vancomycin 6/20 x1  Cefazolin 6/21 x1  Subjective: Poorly responsive, doesn't follow commands, appears comfortable. No events reported overnight.  Objective: Vitals:   08/03/18 0826 08/03/18 2029 08/04/18 0400 08/04/18 0800  BP: 138/86 (!) 132/97  139/88  Pulse:  (!) 103 88 86  Resp:    19  Temp: 98.6 F (37 C) 98.4 F (36.9 C)  98.4 F (36.9 C)  TempSrc: Axillary Axillary  Axillary  SpO2: 97% 98% 98% 96%  Weight:      Height:        Intake/Output Summary (Last 24 hours) at 08/04/2018 1134 Last data filed at 08/04/2018 0400 Gross per 24 hour  Intake 0 ml  Output 550 ml  Net -550 ml   Filed Weights   08/07/2018 1058 07/30/18 1106  Weight: 81.6 kg 77.6 kg    Gen: Chronically ill-appearing male in no acute distress Pulm: Non-labored breathing. Some rattle centrally, no wheezes or crackles. CV: Regular rate and rhythm. No murmur, rub, or gallop. No JVD, no pedal  edema. GI: Abdomen soft, non-tender, non-distended, with normoactive bowel sounds. No organomegaly or masses felt. Ext: Warm, no deformities Skin: No rashes, lesions or ulcers on visualized skin. Neuro: Resting quietly, not meaningfully responsive, opens eyes but did not track or follow commands. Psych: UTD.  Data Reviewed: I have personally  reviewed following labs and imaging studies  CBC: Recent Labs  Lab Nov 16, 2018 1148 07/30/18 0740 07/30/18 1510 07/31/18 0400  WBC 3.7* 3.7* 3.4* 3.6*  NEUTROABS 2.6 2.1 2.0 2.2  HGB 13.4 12.0* 13.0 12.6*  HCT 41.2 36.2* 38.9* 36.6*  MCV 88.4 87.2 87.2 85.1  PLT 243 240 245 227   Basic Metabolic Panel: Recent Labs  Lab Nov 16, 2018 1148 07/30/18 0740 07/30/18 1510 07/31/18 0400  NA 134* 137 138 136  K 4.2 4.2 4.0 3.8  CL 98 102 99 102  CO2 22 25 26  21*  GLUCOSE 249* 167* 162* 190*  BUN 20 16 14 13   CREATININE 0.86 0.80 0.77 0.66  CALCIUM 9.4 8.9 9.3 9.0   GFR: Estimated Creatinine Clearance: 87.6 mL/min (by C-G formula based on SCr of 0.66 mg/dL). Liver Function Tests: Recent Labs  Lab Nov 16, 2018 1148 07/30/18 1510 07/31/18 0400  AST 42* 47* 58*  ALT 54* 63* 72*  ALKPHOS 76 77 75  BILITOT 0.7 0.4 0.5  PROT 7.8 7.9 7.1  ALBUMIN 3.6 3.4* 3.2*   No results for input(s): LIPASE, AMYLASE in the last 168 hours. No results for input(s): AMMONIA in the last 168 hours. Coagulation Profile: No results for input(s): INR, PROTIME in the last 168 hours. Cardiac Enzymes: Recent Labs  Lab Nov 16, 2018 1148 07/30/18 1510  TROPONINI <0.03 <0.03   BNP (last 3 results) No results for input(s): PROBNP in the last 8760 hours. HbA1C: No results for input(s): HGBA1C in the last 72 hours. CBG: Recent Labs  Lab 07/30/18 0557 07/30/18 1600 07/30/18 2158 07/31/18 0645 07/31/18 1349  GLUCAP 144* 156* 175* 180* 201*   Lipid Profile: No results for input(s): CHOL, HDL, LDLCALC, TRIG, CHOLHDL, LDLDIRECT in the last 72 hours. Thyroid Function Tests: No results for input(s): TSH, T4TOTAL, FREET4, T3FREE, THYROIDAB in the last 72 hours. Anemia Panel: No results for input(s): VITAMINB12, FOLATE, FERRITIN, TIBC, IRON, RETICCTPCT in the last 72 hours. Urine analysis:    Component Value Date/Time   COLORURINE RED (A) Oct 06, 202020 1148   APPEARANCEUR TURBID (A) Oct 06, 202020 1148   LABSPEC   Oct 06, 202020 1148    TEST NOT REPORTED DUE TO COLOR INTERFERENCE OF URINE PIGMENT   PHURINE  Oct 06, 202020 1148    TEST NOT REPORTED DUE TO COLOR INTERFERENCE OF URINE PIGMENT   GLUCOSEU (A) Oct 06, 202020 1148    TEST NOT REPORTED DUE TO COLOR INTERFERENCE OF URINE PIGMENT   HGBUR (A) Oct 06, 202020 1148    TEST NOT REPORTED DUE TO COLOR INTERFERENCE OF URINE PIGMENT   HGBUR negative 03/21/2010 0748   BILIRUBINUR (A) Oct 06, 202020 1148    TEST NOT REPORTED DUE TO COLOR INTERFERENCE OF URINE PIGMENT   KETONESUR (A) Oct 06, 202020 1148    TEST NOT REPORTED DUE TO COLOR INTERFERENCE OF URINE PIGMENT   PROTEINUR (A) Oct 06, 202020 1148    TEST NOT REPORTED DUE TO COLOR INTERFERENCE OF URINE PIGMENT   UROBILINOGEN 0.2 03/21/2010 0748   NITRITE (A) Oct 06, 202020 1148    TEST NOT REPORTED DUE TO COLOR INTERFERENCE OF URINE PIGMENT   LEUKOCYTESUR (A) Oct 06, 202020 1148    TEST NOT REPORTED DUE TO COLOR INTERFERENCE OF URINE PIGMENT   Recent Results (from the past 240 hour(s))  Urine culture     Status: Abnormal   Collection Time: 07/12/2018 11:25 AM   Specimen: Urine, Clean Catch  Result Value Ref Range Status   Specimen Description   Final    URINE, CLEAN CATCH Performed at [facility] [place], [address] [place], [place] [address]    Special Requests   Final    NONE Performed at [facility] [place], [address] [place], [place] [address]    Culture >=100,000 COLONIES/mL STAPHYLOCOCCUS EPIDERMIDIS (A)  Final   Report Status 07/31/2018 FINAL  Final   Organism ID, Bacteria STAPHYLOCOCCUS EPIDERMIDIS (A)  Final      Susceptibility   Staphylococcus epidermidis - MIC*    CIPROFLOXACIN >=8 RESISTANT Resistant     GENTAMICIN <=0.5 SENSITIVE Sensitive     NITROFURANTOIN <=16 SENSITIVE Sensitive     OXACILLIN <=0.25 SENSITIVE Sensitive     TETRACYCLINE 2 SENSITIVE Sensitive     VANCOMYCIN 1 SENSITIVE Sensitive     TRIMETH/SULFA <=10 SENSITIVE Sensitive     CLINDAMYCIN <=0.25 SENSITIVE Sensitive      RIFAMPIN <=0.5 SENSITIVE Sensitive     Inducible Clindamycin NEGATIVE Sensitive     * >=100,000 COLONIES/mL STAPHYLOCOCCUS EPIDERMIDIS  SARS Coronavirus 2 (Hosp order,Performed in [facility] lab via [employer] ID)     Status: Abnormal   Collection Time: 07/21/2018 11:25 AM   Specimen: Dry Nasal Swab ([employer]ott ID Now)  Result Value Ref Range Status   SARS Coronavirus 2 ([employer]ott ID Now) POSITIVE (A) NEGATIVE Final    Comment: RESULT CALLED TO, READ BACK BY AND VERIFIED WITH: CINDY REED AT 1223 08/06/2018 BY K BARR (NOTE) SARS CoV 2 target nucleic acids are DETECTED.  The SARS-CoV-2 RNA is generally detectable in upper and lower respiratory specimens during the acute phase of infection. Positiveresults are indicative of active infection with SARS-CoV-2.   Clinical correlation with patient history and other diagnostic information is necessary to determine patient infection status.  Positive results do not rule out bacterial infection or coinfection with other viruses. The expected result is Negative. Fact Sheet for Patients: http://www.graves-ford.org/https://www.fda.gov/media/136524/download Fact Sheet for Healthcare Providers: EnviroConcern.sihttps://www.fda.gov/media/136523/download This test is not yet approved or cleared by the [place] FDA and  has been authorized for detection and/or diagnosis of SARS-CoV-2 by FDA under an Emergency Use Authorization (EUA).  This EUA will remain in effect (meaning this test can be used)  for the duration of  the COVID19 declaration under Section 564(b)(1) of the Act, 21 U.S.C.  section [id number] 3(b)(1), unless the authorization is terminated or revoked sooner. Performed at [facility] [place], [address] [place], [place] [address]   Culture, blood (single)     Status: None   Collection Time: 07/30/18  5:11 PM   Specimen: BLOOD  Result Value Ref Range Status   Specimen Description   Final    BLOOD RIGHT ARM Performed at [facility], [address]  [place], [place] [address]    Special Requests   Final    BOTTLES DRAWN AEROBIC ONLY Blood Culture adequate volume Performed at [facility], [address] [place], [place] [address]    Culture   Final    NO GROWTH 5 DAYS Performed at [facility], [address] [place], [place] [address]    Report Status 08/04/2018 FINAL  Final      Radiology Studies: No results found.  Scheduled Meds: Continuous Infusions:   LOS: 6 days  Time spent: 35 minutes.  Blake Nineyan B Dazani Norby, MD [facility] [email or web address] Password [email or web address] 08/04/2018, 11:34 AM

## 2018-08-04 NOTE — Progress Notes (Signed)
Manufacturing engineer [facility]) [place] continues to not have bed availability today.  ACC will continue to check in and update with changes.  Thank you, Venia Carbon RN, BSN, [facility] Liaison (in [place]) [phone]

## 2018-08-05 LAB — GLUCOSE, CAPILLARY
Glucose-Capillary: 299 mg/dL — ABNORMAL HIGH (ref 70–99)
Glucose-Capillary: 281 mg/dL — ABNORMAL HIGH (ref 70–99)

## 2018-08-05 MED ORDER — INSULIN DETEMIR 100 UNIT/ML ~~LOC~~ SOLN
10.0000 [IU] | Freq: Every day | SUBCUTANEOUS | Status: DC
  Administered 2018-08-05: 10 [IU] via SUBCUTANEOUS
  Filled 2018-08-05 (×2): qty 0.1

## 2018-08-05 NOTE — Progress Notes (Signed)
Telephone call to patient's son, Marijean Bravo, updated on plan of care and answered all questions.  States he would like sister to be added to contacts so she can be updated if she calls, Rico Sheehan.  Added her to list of contacts.

## 2018-08-05 NOTE — Progress Notes (Signed)
[place] [address] [facility] - RN Note  Unfortunately, [facility] is not able to offer a room at this time.  Updated Pricilla Riffle, LCSW and son [name]). Son confirmed continued interest in BP and requested to remain on list for [facility].  ACC will notify SW/son if bed becomes available today and continue to update daily on bed availability.  Please call with any hospice related questions,  Thank you,   Gar Ponto, RN [facility] Liaison (who are listed on [place]) [phone]

## 2018-08-05 NOTE — Progress Notes (Signed)
Dr. Bonner Puna notified [facility]G 299, will order insulin.

## 2018-08-05 NOTE — Progress Notes (Signed)
Patient daughter here to see patient at 1500hrs, visited for 15 minutes, updated on plan of care and answered all questions.  Patient continues to appear comfortable with no signs of pain. CBG at 1720hrs 271, Dr. Bonner Puna notified.

## 2018-08-05 NOTE — Progress Notes (Signed)
PROGRESS NOTE  Brief Narrative: Blake Wajda Wellingtonis a 78 y.o.Mwith hx advanced dementia, home-dwelling, IDDM, and anemia who presented with 4 daysofconfusion, decreased mentation.  All history collected from sons via phone due to patient's poor mentation. Evidently, he was diagnosed with dementia in 2013, had a MMSE 18/30 as far ago as 2016 and 9/30 as long ago as Feb 2019. In the last six months (per Neuro note in Mar 2020), he had progressed to needing total assist with toileting, feeding, "minimal verbal response", and pocketing pills. Taken off Aricept/Namenda at that time.  In that context, family noted in the last week that he was even more weak than usual. Mentation was declining. He would not eat at all. They deny fever, cough, dyspnea. No focal weakness, no complaints of pain anywhere.   They brought him to the ER to be evaluated, and there he was hypoxic to 88%,CXR without opacity. CT head and C-spine unremarkable for stroke or fracture or IC bleeding.   SARS-CoV-2+, lymphopenia, mild transaminitis. Lactate normal, troponin undetectable. Creatinine normal, hyperglycemia. Urine was "almost black in color" per EDP, and urinalysis showed many bacteria, white blood cells. He was given ceftriaxone, IV fluids, the hospitalist service were asked to acceptfor admission for dehydration, UTI, and acute metabolic encephalopathy. He was treated supportively without improvement and decision was made to pursue hospice services and convert to comfort measures only.  Subjective: Nonverbal, receiving morphine several times per day, has not been agitated or uncomfortable.  Objective: BP 127/85 (BP Location: Left Arm)   Pulse 88   Temp 98.2 F (36.8 C) (Oral)   Resp 19   Ht 6\' 2"  (1.88 m)   Wt 77.6 kg   SpO2 99%   BMI 21.96 kg/m   General: Nonverbal in no distress resting quietly with eyes closed Cardiovascular: Regular, no edema Respiratory: Nonlabored Neuro: Moves  upper extremities spontaneously, not following commands. Opens eyes intermittently.  Assessment & Plan: Acute metabolic encephalopathy on chronic advanced dementia: Due to infection.  - Continue comfort medications as ordered.  Covid-19 infection: No pneumonia. This is causing transaminitis and lymphopenia.  - Supportive care  Sepsis due to UTI due to S. epidermidis: Based on culture data.  - Comfort measures  Severe protein calorie malnutrition:  - Can give supplement if he desires, but currently not taking po.  Chronic iron deficiency anemia:  - No interventions planned.   T2DM:  - Check CBGs intermittently to ensure we're not missing severe hyperglycemia as he was previously on levemir 35 units.   Disposition: Patient remains stable for transfer with continued guarded prognosis. Family to visit again today. Today there is no bed available at [facility], the only residential hospice facility the family would like the patient discharged to. We will continue to monitor him here until a bed is available.   [name], MD Pager [phone] 08/05/2018, 11:29 AM

## 2018-08-06 LAB — GLUCOSE, CAPILLARY
Glucose-Capillary: 268 mg/dL — ABNORMAL HIGH (ref 70–99)
Glucose-Capillary: 258 mg/dL — ABNORMAL HIGH (ref 70–99)

## 2018-08-06 MED ORDER — ENSURE ENLIVE PO LIQD: 237.0000 mL | Freq: Two times a day (BID) | ORAL | Status: DC

## 2018-08-06 MED ORDER — INSULIN DETEMIR 100 UNIT/ML ~~LOC~~ SOLN
20.0000 [IU] | Freq: Every day | SUBCUTANEOUS | Status: DC
  Administered 2018-08-06: 20 [IU] via SUBCUTANEOUS
  Filled 2018-08-06 (×2): qty 0.2

## 2018-08-06 NOTE — Progress Notes (Signed)
[facility])  Unfortunately [facility] is not able to offer a room at this time.  LCSW Caryl Pina and son [name] aware. Will continue to follow and update [name] and hospital staff if room becomes available.   Please call with any hospice related questions,  Thank you,  Heloise Purpura [phone]  [facility] liaisons are listed daily on AMION under Hospice and [place]

## 2018-08-06 NOTE — Progress Notes (Signed)
Spoke with son [name], answered all questions and concerns.

## 2018-08-06 NOTE — Plan of Care (Signed)
  Problem: Clinical Measurements: Goal: Respiratory complications will improve Outcome: Progressing Goal: Cardiovascular complication will be avoided Outcome: Progressing   Problem: Safety: Goal: Ability to remain free from injury will improve Outcome: Progressing   Problem: Skin Integrity: Goal: Risk for impaired skin integrity will decrease Outcome: Progressing   Problem: Education: Goal: Knowledge of General Education information will improve Description: Including pain rating scale, medication(s)/side effects and non-pharmacologic comfort measures Outcome: Not Progressing Note: D/t cognitive deficits   Problem: Health Behavior/Discharge Planning: Goal: Ability to manage health-related needs will improve Outcome: Not Progressing Note: D/t cognitive deficits   Problem: Clinical Measurements: Goal: Ability to maintain clinical measurements within normal limits will improve Outcome: Not Progressing Note: D/t cognitive deficits Goal: Diagnostic test results will improve Outcome: Not Progressing Note: D/t cognitive deficits   Problem: Nutrition: Goal: Adequate nutrition will be maintained Outcome: Not Progressing Note: Poor appetite

## 2018-08-06 NOTE — Progress Notes (Signed)
PROGRESS NOTE  Brief Narrative: Blake Kasparek Wellingtonis a 78 y.o.Mwith hx advanced dementia, home-dwelling, IDDM, and anemia who presented with 4 daysofconfusion, decreased mentation.  All history collected from sons via phone due to patient's poor mentation. Evidently, he was diagnosed with dementia in 2013, had a MMSE 18/30 as far ago as 2016 and 9/30 as long ago as Feb 2019. In the last six months (per Neuro note in Mar 2020), he had progressed to needing total assist with toileting, feeding, "minimal verbal response", and pocketing pills. Taken off Aricept/Namenda at that time.  In that context, family noted in the last week that he was even more weak than usual. Mentation was declining. He would not eat at all. They deny fever, cough, dyspnea. No focal weakness, no complaints of pain anywhere.   They brought him to the ER to be evaluated, and there he was hypoxic to 88%,CXR without opacity. CT head and C-spine unremarkable for stroke or fracture or IC bleeding.   SARS-CoV-2+, lymphopenia, mild transaminitis. Lactate normal, troponin undetectable. Creatinine normal, hyperglycemia. Urine was "almost black in color" per EDP, and urinalysis showed many bacteria, white blood cells. He was given ceftriaxone, IV fluids, the hospitalist service were asked to acceptfor admission for dehydration, UTI, and acute metabolic encephalopathy. He was treated supportively without improvement and decision was made to pursue hospice services and convert to comfort measures only.  Subjective: No changes. Daughter was allowed to visit briefly yesterday. No changes reported by RN staff.   Objective: BP 123/83 (BP Location: Left Arm)   Pulse 85   Temp 98.4 F (36.9 C) (Axillary)   Resp 14   Ht 6\' 2"  (1.88 m)   Wt 77.6 kg   SpO2 99%   BMI 21.96 kg/m   General: Calm in no distress resting with eyes closed.  Cardiovascular: Regular, no JVD or edema Respiratory: Nonlabored on room  air Neuro: Responsive/withdraws to some tactile stimuli, doesn't open eyes today, moves arms spontaneously. No changes.   Assessment & Plan: Acute metabolic encephalopathy on chronic advanced dementia: Due to infection.  - Continue comfort medications as ordered.  Covid-19 infection: No pneumonia. This is causing transaminitis and lymphopenia.  - Supportive care  Sepsis due to UTI due to S. epidermidis: Based on culture data.  - Comfort measures  Severe protein calorie malnutrition:  - Can give supplement if he desires, but currently not taking po.  Chronic iron deficiency anemia:  - No interventions planned.   T2DM:  - Check CBGs intermittently to ensure we're not missing severe hyperglycemia as he was previously on levemir 35 units. Started on 10u yesterday, will increase given persistent hyperglycemia.   Disposition: Patient remains stable for transfer with continued guarded prognosis. We will continue to monitor him here until a bed is available.   [name], MD Pager [phone] 08/06/2018, 9:16 AM

## 2018-08-06 NOTE — Progress Notes (Addendum)
7106  Resting in bed with eyes closed.  Responds to painful stimuli.  Resp e/u.  On room air.  No s./s distress noted at this time.   1700  Resting in bed with eyes open.  No verbal response.  No s/s distress noted.  1800  Updated son by phone.

## 2018-08-07 LAB — GLUCOSE, CAPILLARY
Glucose-Capillary: 261 mg/dL — ABNORMAL HIGH (ref 70–99)
Glucose-Capillary: 280 mg/dL — ABNORMAL HIGH (ref 70–99)

## 2018-08-07 MED ORDER — INSULIN DETEMIR 100 UNIT/ML ~~LOC~~ SOLN
30.0000 [IU] | Freq: Every day | SUBCUTANEOUS | Status: DC
  Administered 2018-08-07 – 2018-08-09 (×3): 30 [IU] via SUBCUTANEOUS
  Filled 2018-08-07 (×4): qty 0.3

## 2018-08-07 NOTE — Progress Notes (Addendum)
0751  Resting in bed with eyes closed.  Responds to stimulation.  No verbal response.  Opens eyes.  Resp e/u.  On room air.  No s/s distress noted.  Repositioned for comfort.  0844  Attempted to assist with breakfast.  Refused to open mouth  1200  Attempted to feed him lunch.  Refused to open his mouth.  Face washed with warm cloth.   Repositioned for comfort.   [place]  Updated son, [name] over the phone.  Informed him there is still no bed available at [facility].

## 2018-08-07 NOTE — Progress Notes (Signed)
Manufacturing engineer [facility]) [place] does not have any beds to offer today.    ACC will continue to follow and update if one should become available.  Thank you, Venia Carbon RN, BSN, [facility] Liaison (in [place]) [phone]

## 2018-08-07 NOTE — Progress Notes (Signed)
Spoke with son Blake Wilcox and answered all questions and concerns.

## 2018-08-07 NOTE — Progress Notes (Signed)
PROGRESS NOTE  Brief Narrative: Blake Wilcox a 78 y.o.Mwith hx advanced dementia, home-dwelling, IDDM, and anemia who presented with 4 daysofconfusion, decreased mentation.  All history collected from sons via phone due to patient's poor mentation. Evidently, he was diagnosed with dementia in 2013, had a MMSE 18/30 as far ago as 2016 and 9/30 as long ago as Feb 2019. In the last six months (per Neuro note in Mar 2020), he had progressed to needing total assist with toileting, feeding, "minimal verbal response", and pocketing pills. Taken off Aricept/Namenda at that time.  In that context, family noted in the last week that he was even more weak than usual. Mentation was declining. He would not eat at all. They deny fever, cough, dyspnea. No focal weakness, no complaints of pain anywhere.   They brought him to the ER to be evaluated, and there he was hypoxic to 88%,CXR without opacity. CT head and C-spine unremarkable for stroke or fracture or IC bleeding.   SARS-CoV-2+, lymphopenia, mild transaminitis. Lactate normal, troponin undetectable. Creatinine normal, hyperglycemia. Urine was "almost black in color" per EDP, and urinalysis showed many bacteria, white blood cells. He was given ceftriaxone, IV fluids, the hospitalist service were asked to acceptfor admission for dehydration, UTI, and acute metabolic encephalopathy. He was treated supportively without improvement and decision was made to pursue hospice services and convert to comfort measures only.  Subjective: No changes reported by RN staff.   Objective: BP 119/86 (BP Location: Left Arm)   Pulse 85   Temp 97.9 F (36.6 C) (Axillary)   Resp 18   Ht 6\' 2"  (1.88 m)   Wt 77.6 kg   SpO2 95%   BMI 21.96 kg/m   General: No distress Respiratory: Nonlabored  Neuro: Opens eyes to tactile and some verbal input.   Assessment & Plan: Acute metabolic encephalopathy on chronic advanced dementia: Due to  infection.  - Continue comfort medications as ordered.  Covid-19 infection: No pneumonia. This is causing transaminitis and lymphopenia.  - Supportive care  Sepsis due to UTI due to S. epidermidis: Based on culture data.  - Comfort measures  Severe protein calorie malnutrition:  - Can give supplement if he desires, but currently not taking po.  Chronic iron deficiency anemia:  - No interventions planned.   T2DM:  - Check CBGs intermittently to ensure we're not missing severe hyperglycemia as he was previously on levemir 35 units. Started on 10u yesterday, will increase given persistent hyperglycemia.   Disposition: Patient remains stable for transfer with continued guarded prognosis. We will continue to monitor him here until a bed is available.   [name], MD Pager [phone] 08/07/2018, 2:23 PM

## 2018-08-07 NOTE — Plan of Care (Signed)
  Problem: Clinical Measurements: Goal: Ability to maintain clinical measurements within normal limits will improve Outcome: Progressing Goal: Diagnostic test results will improve Outcome: Progressing Goal: Respiratory complications will improve Outcome: Progressing Goal: Cardiovascular complication will be avoided Outcome: Progressing   Problem: Safety: Goal: Ability to remain free from injury will improve Outcome: Progressing   Problem: Skin Integrity: Goal: Risk for impaired skin integrity will decrease Outcome: Progressing   Problem: Education: Goal: Knowledge of General Education information will improve Description: Including pain rating scale, medication(s)/side effects and non-pharmacologic comfort measures Outcome: Not Progressing Note: Due to cognitive deficits   Problem: Health Behavior/Discharge Planning: Goal: Ability to manage health-related needs will improve Outcome: Not Progressing Note: Due to cognitive deficits   Problem: Activity: Goal: Risk for activity intolerance will decrease Outcome: Not Progressing Note: Due to physical abilities   Problem: Nutrition: Goal: Adequate nutrition will be maintained Outcome: Not Progressing Note: Poor appetite, refused to eat

## 2018-08-08 LAB — GLUCOSE, CAPILLARY: Glucose-Capillary: 271 mg/dL — ABNORMAL HIGH (ref 70–99)

## 2018-08-08 NOTE — Plan of Care (Signed)
  Problem: Clinical Measurements: Goal: Ability to maintain clinical measurements within normal limits will improve Outcome: Progressing Goal: Diagnostic test results will improve Outcome: Progressing Goal: Respiratory complications will improve Outcome: Progressing Goal: Cardiovascular complication will be avoided Outcome: Progressing   Problem: Pain Managment: Goal: General experience of comfort will improve Outcome: Progressing Note: No s/s of pain   Problem: Safety: Goal: Ability to remain free from injury will improve Outcome: Progressing   Problem: Skin Integrity: Goal: Risk for impaired skin integrity will decrease Outcome: Progressing   Problem: Education: Goal: Knowledge of General Education information will improve Description: Including pain rating scale, medication(s)/side effects and non-pharmacologic comfort measures Outcome: Not Progressing Note: Due to cognitive deficits    Problem: Health Behavior/Discharge Planning: Goal: Ability to manage health-related needs will improve Outcome: Not Progressing Note: Due to cognitive deficits   Problem: Activity: Goal: Risk for activity intolerance will decrease Outcome: Not Progressing Note: immobile   Problem: Nutrition: Goal: Adequate nutrition will be maintained Outcome: Not Progressing Note: Refuses to eat

## 2018-08-08 NOTE — Progress Notes (Signed)
Nutrition Brief Note  Patient identified via MST screen.  Chart reviewed. Pt transitioning to comfort care.  No nutrition interventions warranted at this time.   Clayton Bibles, MS, RD, LDN Inpatient Clinical Dietitian Pager: [phone] After Hours Pager: [phone]

## 2018-08-08 NOTE — Progress Notes (Signed)
PROGRESS NOTE  Brief Narrative: Blake Tasker [place] a 78 y.o.Mwith hx advanced dementia, home-dwelling, IDDM, and anemia who presented with 4 daysofconfusion, decreased mentation.  All history collected from sons via phone due to patient's poor mentation. Evidently, he was diagnosed with dementia in 2013, had a MMSE 18/30 as far ago as 2016 and 9/30 as long ago as Feb 2019. In the last six months (per Neuro note in Mar 2020), he had progressed to needing total assist with toileting, feeding, "minimal verbal response", and pocketing pills. Taken off Aricept/Namenda at that time.  In that context, family noted in the last week that he was even more weak than usual. Mentation was declining. He would not eat at all. They deny fever, cough, dyspnea. No focal weakness, no complaints of pain anywhere.   They brought him to the ER to be evaluated, and there he was hypoxic to 88%,CXR without opacity. CT head and C-spine unremarkable for stroke or fracture or IC bleeding.   SARS-CoV-2+, lymphopenia, mild transaminitis. Lactate normal, troponin undetectable. Creatinine normal, hyperglycemia. Urine was "almost black in color" per EDP, and urinalysis showed many bacteria, white blood cells. He was given ceftriaxone, IV fluids, the hospitalist service were asked to acceptfor admission for dehydration, UTI, and acute metabolic encephalopathy. He was treated supportively without improvement and decision was made to pursue hospice services and convert to comfort measures only.  Subjective: Nonverbal, unable to feed the patient yesterday due to refusal to open mouth.   Objective: BP 126/86 (BP Location: Left Arm)   Pulse 94   Temp (!) 97.4 F (36.3 C) (Axillary)   Resp 20   Ht 6\' 2"  (1.88 m)   Wt 77.6 kg   SpO2 97%   BMI 21.96 kg/m   General: No distress Respiratory: Nonlabored on room air  Assessment & Plan: Acute metabolic encephalopathy on chronic advanced dementia: Due to  infection.  - Continue comfort medications as ordered.  Covid-19 infection: No pneumonia. This is causing transaminitis and lymphopenia.  - Supportive care  Sepsis due to UTI due to S. epidermidis: Based on culture data.  - Comfort measures  Severe protein calorie malnutrition:  - Can give supplement if he desires, but currently not taking po.  Chronic iron deficiency anemia:  - No interventions planned.   T2DM:  - Check CBGs intermittently to ensure we're not missing severe hyperglycemia as he was previously on levemir 35 units. continue insulin as ordered. Do not want to precipitate hypoglycemia.  Disposition: Patient remains stable for transfer with continued guarded prognosis. We will continue to monitor him here until a bed is available.   [name], MD Pager [phone] 08/08/2018, 11:47 AM

## 2018-08-09 LAB — GLUCOSE, CAPILLARY
Glucose-Capillary: 299 mg/dL — ABNORMAL HIGH (ref 70–99)
Glucose-Capillary: 319 mg/dL — ABNORMAL HIGH (ref 70–99)
Glucose-Capillary: 332 mg/dL — ABNORMAL HIGH (ref 70–99)

## 2018-08-09 NOTE — Progress Notes (Signed)
CSW reached out to [facility], reports still not beds avail.   CSW will follow up with family to see if they will consider expanding search once again, family resistant on past few calls and want to wait for [place].   CSW will continue to follow and f/u with [facility] daily.   [place], [place]

## 2018-08-09 NOTE — Progress Notes (Signed)
PROGRESS NOTE  Brief Narrative: Blake Hintz [place] a 78 y.o.Mwith hx advanced dementia, home-dwelling, IDDM, and anemia who presented with 4 daysofconfusion, decreased mentation.  All history collected from sons via phone due to patient's poor mentation. Evidently, he was diagnosed with dementia in 2013, had a MMSE 18/30 as far ago as 2016 and 9/30 as long ago as Feb 2019. In the last six months (per Neuro note in Mar 2020), he had progressed to needing total assist with toileting, feeding, "minimal verbal response", and pocketing pills. Taken off Aricept/Namenda at that time.  In that context, family noted in the last week that he was even more weak than usual. Mentation was declining. He would not eat at all. They deny fever, cough, dyspnea. No focal weakness, no complaints of pain anywhere.   They brought him to the ER to be evaluated, and there he was hypoxic to 88%,CXR without opacity. CT head and C-spine unremarkable for stroke or fracture or IC bleeding.   SARS-CoV-2+, lymphopenia, mild transaminitis. Lactate normal, troponin undetectable. Creatinine normal, hyperglycemia. Urine was "almost black in color" per EDP, and urinalysis showed many bacteria, white blood cells. He was given ceftriaxone, IV fluids, the hospitalist service were asked to acceptfor admission for dehydration, UTI, and acute metabolic encephalopathy. He was treated supportively without improvement and decision was made to pursue hospice services and convert to comfort measures only.  Subjective: Nonverbal, still no po intake and minimal UOP.  Objective: BP 129/84 (BP Location: Left Arm)   Pulse 98   Temp 98.2 F (36.8 C) (Axillary)   Resp 20   Ht 6\' 2"  (1.88 m)   Wt 77.6 kg   SpO2 100%   BMI 21.96 kg/m   General: No distress Respiratory: Nonlabored on room air CV: Borderline tachycardic, regular  Assessment & Plan: Acute metabolic encephalopathy on chronic advanced dementia: Due  to infection.  - Continue comfort medications as ordered.  Covid-19 infection: No pneumonia. This is causing transaminitis and lymphopenia.  - Supportive care  Sepsis due to UTI due to S. epidermidis: Based on culture data.  - Comfort measures  Severe protein calorie malnutrition:  - Can give supplement if he desires, but currently not taking po.  Chronic iron deficiency anemia:  - No interventions planned.   T2DM:  - Check CBGs intermittently to ensure we're not missing severe hyperglycemia as he was previously on levemir 35 units. continue insulin as ordered. Do not want to precipitate hypoglycemia.  Disposition: Patient remains stable for transfer with continued guarded prognosis. We will continue to monitor him here until a bed is available.  [name], MD Pager [phone] 08/09/2018, 8:55 AM

## 2018-08-09 NOTE — Progress Notes (Signed)
Pt son [name] contacted and updated.  Son states that he might call tomorrow so his mother (pt spouse) can facetime pt.

## 2018-08-09 NOTE — Plan of Care (Signed)
Pt not progressing but adequate for d/c.  Pt nonverbal with dementia on comfort care awaiting bed at [facility].

## 2018-08-10 LAB — GLUCOSE, CAPILLARY: Glucose-Capillary: 415 mg/dL — ABNORMAL HIGH (ref 70–99)

## 2018-08-10 MED ORDER — INSULIN DETEMIR 100 UNIT/ML ~~LOC~~ SOLN
40.0000 [IU] | Freq: Every day | SUBCUTANEOUS | Status: DC
  Administered 2018-08-10 – 2018-08-11 (×2): 40 [IU] via SUBCUTANEOUS
  Filled 2018-08-10 (×2): qty 0.4

## 2018-08-10 NOTE — Progress Notes (Signed)
CSW consulted with son [name] and explained to him that [facility] place is still unable to accept patient. Their census has to be below a certain level to have staff to accept a COVID patient, and due to their very high census lately they are still unable to accept a COVID patient at this time.   [name] reports he does not want to look at other options for residential hospice at this time, and reports that him and his family are in agreement on continuing to wait for [facility] bed availability.   [name] asked CSW to follow up with him on Monday 7/6 to continue to provide him updates on where we are with bed avail.   [place], [place]

## 2018-08-10 NOTE — Progress Notes (Signed)
PROGRESS NOTE  Brief Narrative: Blake Wilcox a 78 y.o.Mwith hx advanced dementia, home-dwelling, IDDM, and anemia who presented with 4 daysofconfusion, decreased mentation.  All history collected from sons via phone due to patient's poor mentation. Evidently, he was diagnosed with dementia in 2013, had a MMSE 18/30 as far ago as 2016 and 9/30 as long ago as Feb 2019. In the last six months (per Neuro note in Mar 2020), he had progressed to needing total assist with toileting, feeding, "minimal verbal response", and pocketing pills. Taken off Aricept/Namenda at that time.  In that context, family noted in the last week that he was even more weak than usual. Mentation was declining. He would not eat at all. They deny fever, cough, dyspnea. No focal weakness, no complaints of pain anywhere.   They brought him to the ER to be evaluated, and there he was hypoxic to 88%,CXR without opacity. CT head and C-spine unremarkable for stroke or fracture or IC bleeding.   SARS-CoV-2+, lymphopenia, mild transaminitis. Lactate normal, troponin undetectable. Creatinine normal, hyperglycemia. Urine was "almost black in color" per EDP, and urinalysis showed many bacteria, white blood cells. He was given ceftriaxone, IV fluids, the hospitalist service were asked to acceptfor admission for dehydration, UTI, and acute metabolic encephalopathy. He was treated supportively without improvement and decision was made to pursue hospice services and convert to comfort measures only.  Subjective: Minimal po, still making urine, nonverbal, not meaningfully responsive. Tan secretions noted by RN.  Objective: BP 110/86   Pulse (!) 102   Temp 99 F (37.2 C) (Axillary)   Resp 14   Ht 6\' 2"  (1.88 m)   Wt 77.6 kg   SpO2 97%   BMI 21.96 kg/m   General: No distress Respiratory: Nonlabored, coarse CV: Regular tachycardia  Assessment & Plan: Acute metabolic encephalopathy on chronic advanced  dementia: Due to infection.  - Continue comfort medications as ordered.  Covid-19 infection: No pneumonia. This is causing transaminitis and lymphopenia.  - Supportive care  Sepsis due to UTI due to S. epidermidis: Based on culture data.  - Comfort measures  Severe protein calorie malnutrition:  - Can give supplement if he desires, but currently not taking po.  Chronic iron deficiency anemia:  - No interventions planned.   T2DM:  - Continue insulin, increase dose as CBGs have been in 300's. Do not want to precipitate hypoglycemia.  Disposition: Patient remains stable for transfer with continued guarded prognosis. We will continue to monitor him here until a bed is available.  [name], MD Pager [phone] 08/10/2018, 10:58 AM

## 2018-08-11 LAB — GLUCOSE, CAPILLARY: Glucose-Capillary: 563 mg/dL (ref 70–99)

## 2018-08-11 DEATH — deceased

## 2018-09-11 NOTE — Progress Notes (Signed)
CBG 563 at 0723hrs.  Dr. Waldron Labs notified.

## 2018-09-11 NOTE — Progress Notes (Addendum)
0015  Pt resting.  No s/sx of distress/pain. CLWR.  2820  Pt resting.  No s/sx of distress/pain. CLWR.

## 2018-09-11 NOTE — Progress Notes (Signed)
Patient noted to have no pulse, no respirations.  Dr. Waldron Labs notified patient expired 1139hrs.

## 2018-09-11 NOTE — Progress Notes (Signed)
Telephone call to patient's son, Marijean Bravo, updated on plan of care and answered all questions.

## 2018-09-11 NOTE — Progress Notes (Signed)
PROGRESS NOTE  Brief Narrative: Taichi Repka [place]is a 78 y.o.Mwith hx advanced dementia, home-dwelling, IDDM, and anemia who presented with 4 daysofconfusion, decreased mentation.  All history collected from sons via phone due to patient's poor mentation. Evidently, he was diagnosed with dementia in 2013, had a MMSE 18/30 as far ago as 2016 and 9/30 as long ago as Feb 2019. In the last six months (per Neuro note in Mar 2020), he had progressed to needing total assist with toileting, feeding, "minimal verbal response", and pocketing pills. Taken off Aricept/Namenda at that time.  In that context, family noted in the last week that he was even more weak than usual. Mentation was declining. He would not eat at all. They deny fever, cough, dyspnea. No focal weakness, no complaints of pain anywhere.   They brought him to the ER to be evaluated, and there he was hypoxic to 88%,CXR without opacity. CT head and C-spine unremarkable for stroke or fracture or IC bleeding.   SARS-CoV-2+, lymphopenia, mild transaminitis. Lactate normal, troponin undetectable. Creatinine normal, hyperglycemia. Urine was "almost black in color" per EDP, and urinalysis showed many bacteria, white blood cells. He was given ceftriaxone, IV fluids, the hospitalist service were asked to acceptfor admission for dehydration, UTI, and acute metabolic encephalopathy. He was treated supportively without improvement and decision was made to pursue hospice services and convert to comfort measures only.  Subjective: Patient unresponsive, comatose, significant event per staff .    Objective: BP (!) 82/53 (BP Location: Left Arm)   Pulse (!) 138   Temp 99.2 F (37.3 C) (Axillary)   Resp (!) 26   Ht 6\' 2"  (1.88 m)   Wt 77.6 kg   SpO2 (!) 86%   BMI 21.96 kg/m    General:  Unresponsive, appears comfortable Respiratory: nonlabord, no wheezing CV: Regular  Assessment & Plan: Acute metabolic encephalopathy  on chronic advanced dementia: Due to infection.  - Continue comfort medications as ordered.  Covid-19 infection: No pneumonia. This is causing transaminitis and lymphopenia.  - Supportive care  Sepsis due to UTI due to S. epidermidis: Based on culture data.  - Comfort measures  Severe protein calorie malnutrition:  - Can give supplement if he desires, but currently not taking po.  Chronic iron deficiency anemia:  - No interventions planned.   T2DM:  - Continue insulin, CBGs remained elevated, I will not increase his Lantus any further, as  Do not want to precipitate hypoglycemia.  Disposition: Patient remains stable for transfer with continued guarded prognosis. We will continue to monitor him here until a bed is available.  Phillips Climes, MD Pager [phone] 09/04/2018, 10:33 AM

## 2018-09-11 NOTE — Death Summary Note (Signed)
DEATH SUMMARY   Patient Details  Name: Blake Wilcox MRN: [id number] DOB: 1940-06-25  Admission/Discharge Information   Admit Date:  Jul 31, 2018  Date of Death: Date of Death: 13-Aug-2018  Time of Death: Time of Death: 05/26/37  Length of Stay: May 26, 2022  Referring Physician: Colon Branch, MD   Reason(s) for Hospitalization   COVID 19 infection  Cause of death - COVID 01-Jun-2022 infection  Diagnoses  Preliminary cause of death:  Secondary Diagnoses (including complications and co-morbidities):  Principal Problem:   Acute metabolic encephalopathy Active Problems:   ANEMIA-NOS   Dementia ([place])   Diabetes ([place])   COVID-19 virus infection   Dehydration   Acute lower UTI   Brief Hospital Course (including significant findings, care, treatment, and services provided and events leading to death)  Blake Wilcox is a 78 y.o. year old male who with hx advanced dementia, home-dwelling, IDDM, and anemia who presented with 4 daysofconfusion, decreased mentation.  All history collected from sons via phone due to patient's poor mentation. Evidently, he was diagnosed with dementia in 27-May-2011, had a MMSE 18/30 as far ago as May 27, 2014 and 9/30 as long ago as Feb 2019. In the last six months (per Neuro note in Mar 2020), he had progressed to needing total assist with toileting, feeding, "minimal verbal response", and pocketing pills. Taken off Aricept/Namenda at that time.  In that context, family noted in the last week that he was even more weak than usual. Mentation was declining. He would not eat at all. They deny fever, cough, dyspnea. No focal weakness, no complaints of pain anywhere.   They brought him to the ER to be evaluated, and there he was hypoxic to 88%,CXR without opacity. CT head and C-spine unremarkable for stroke or fracture or IC bleeding.   SARS-CoV-2+, lymphopenia, mild transaminitis. Lactate normal, troponin undetectable. Creatinine normal, hyperglycemia. Urine was  "almost black in color" per EDP, and urinalysis showed many bacteria, white blood cells. He was given ceftriaxone, IV fluids, the hospitalist service were asked to acceptfor admission for dehydration, UTI, and acute metabolic encephalopathy. He was treated supportively without improvement and decision was made to pursue hospice servicesand convert to comfort measures only.   Acute metabolic encephalopathy on chronic advanced dementia: Due to infection.   Covid-19 infection: No pneumonia. This is causing transaminitis and lymphopenia.   Sepsis due to UTI due to S. epidermidis: Based on culture data.   Severe protein calorie malnutrition:   Chronic iron deficiency anemia:   T2DM:    Pertinent Labs and Studies  Significant Diagnostic Studies Ct Head Wo Contrast  Result Date: 31-Jul-2018 CLINICAL DATA:  Pt sent from PCP office with his son who reports general decline in health over the past week. Denies cough, N/V/D. Pt is non verbal and has been non ambulatory for 1 week. Patient winces as if in pain when touched, patient unable to straighten head and neck per son, unsure if patient has had any type of injury, patient is covid +, no other complaints EXAM: CT HEAD WITHOUT CONTRAST CT CERVICAL SPINE WITHOUT CONTRAST TECHNIQUE: Multidetector CT imaging of the head and cervical spine was performed following the standard protocol without intravenous contrast. Multiplanar CT image reconstructions of the cervical spine were also generated. COMPARISON:  02/25/2012 FINDINGS: CT HEAD FINDINGS Brain: There is ventricular enlargement, greater than the sulci, raising suspicion for a chronic degree of hydrocephalus versus a predominance of central volume loss. Patchy periventricular white matter hypoattenuation is also noted consistent with mild chronic  microvascular ischemic change. No parenchymal masses or mass effect, no evidence an infarct, no extra-axial masses or abnormal fluid collections and no  intracranial hemorrhage. Vascular: No hyperdense vessel or unexpected calcification. Skull: Normal. Negative for fracture or focal lesion. Sinuses/Orbits: Globes and orbits are unremarkable. Sinuses and mastoid air cells are clear. Other: None. CT CERVICAL SPINE FINDINGS Alignment: No spondylolisthesis.  Head is tilted to the left. Skull base and vertebrae: No acute fracture. No primary bone lesion or focal pathologic process. Soft tissues and spinal canal: No prevertebral fluid or swelling. No visible canal hematoma. Disc levels: Mild loss of disc height at C3-C4, C4-C5 and C6-C7. No convincing disc herniation. No significant disc bulging. Upper chest: No mass or adenopathy.  No acute findings. Other: None. IMPRESSION: HEAD CT 1. No acute intracranial abnormalities. 2. Possible chronic hydrocephalus versus a predominance of central volume loss leading to ventriculomegaly. Mild chronic microvascular ischemic change. CERVICAL CT 1. No fracture or acute finding. Electronically Signed   By: David  OrmonAmie Portlandd M.D.   On: 01/22/2019 14:54   Ct Cervical Spine Wo Contrast  Result Date: 02-Feb-2019 CLINICAL DATA:  Pt sent from PCP office with his son who reports general decline in health over the past week. Denies cough, N/V/D. Pt is non verbal and has been non ambulatory for 1 week. Patient winces as if in pain when touched, patient unable to straighten head and neck per son, unsure if patient has had any type of injury, patient is covid +, no other complaints EXAM: CT HEAD WITHOUT CONTRAST CT CERVICAL SPINE WITHOUT CONTRAST TECHNIQUE: Multidetector CT imaging of the head and cervical spine was performed following the standard protocol without intravenous contrast. Multiplanar CT image reconstructions of the cervical spine were also generated. COMPARISON:  02/25/2012 FINDINGS: CT HEAD FINDINGS Brain: There is ventricular enlargement, greater than the sulci, raising suspicion for a chronic degree of hydrocephalus versus a  predominance of central volume loss. Patchy periventricular white matter hypoattenuation is also noted consistent with mild chronic microvascular ischemic change. No parenchymal masses or mass effect, no evidence an infarct, no extra-axial masses or abnormal fluid collections and no intracranial hemorrhage. Vascular: No hyperdense vessel or unexpected calcification. Skull: Normal. Negative for fracture or focal lesion. Sinuses/Orbits: Globes and orbits are unremarkable. Sinuses and mastoid air cells are clear. Other: None. CT CERVICAL SPINE FINDINGS Alignment: No spondylolisthesis.  Head is tilted to the left. Skull base and vertebrae: No acute fracture. No primary bone lesion or focal pathologic process. Soft tissues and spinal canal: No prevertebral fluid or swelling. No visible canal hematoma. Disc levels: Mild loss of disc height at C3-C4, C4-C5 and C6-C7. No convincing disc herniation. No significant disc bulging. Upper chest: No mass or adenopathy.  No acute findings. Other: None. IMPRESSION: HEAD CT 1. No acute intracranial abnormalities. 2. Possible chronic hydrocephalus versus a predominance of central volume loss leading to ventriculomegaly. Mild chronic microvascular ischemic change. CERVICAL CT 1. No fracture or acute finding. Electronically Signed   By: Amie Portlandavid  Ormond M.D.   On: 01/22/2019 14:54   Dg Chest Port 1 View  Result Date: 02-Feb-2019 CLINICAL DATA:  Pt is nonverbal,son states his health has gone downhill,no cough,being tested for covid EXAM: PORTABLE CHEST 1 VIEW COMPARISON:  None. FINDINGS: Cardiac silhouette is normal in size. No mediastinal or hilar masses. No evidence of adenopathy. Lung volumes are low. This leads to crowding of the bronchovascular structures at the bases. Allowing for this, the lungs are clear. No pleural effusion or pneumothorax. Skeletal  structures are grossly intact. IMPRESSION: No active disease. Electronically Signed   By: Amie Portlandavid  Ormond M.D.   On: May 11, 2018 12:07     Microbiology No results found for this or any previous visit (from the past 240 hour(s)).  Lab Basic Metabolic Panel: No results for input(s): NA, K, CL, CO2, GLUCOSE, BUN, CREATININE, CALCIUM, MG, PHOS in the last 168 hours. Liver Function Tests: No results for input(s): AST, ALT, ALKPHOS, BILITOT, PROT, ALBUMIN in the last 168 hours. No results for input(s): LIPASE, AMYLASE in the last 168 hours. No results for input(s): AMMONIA in the last 168 hours. CBC: No results for input(s): WBC, NEUTROABS, HGB, HCT, MCV, PLT in the last 168 hours. Cardiac Enzymes: No results for input(s): CKTOTAL, CKMB, CKMBINDEX, TROPONINI in the last 168 hours. Sepsis Labs: No results for input(s): PROCALCITON, WBC, LATICACIDVEN in the last 168 hours.  Procedures/Operations  None   Nichole Neyer 08/21/2018, 2:19 PM

## 2018-09-11 NOTE — Progress Notes (Signed)
Telephone call to patient's son, Marijean Bravo, to notify that patient had expired.  All questions answered.  Advised patient had no belongings except small New Testament, son asked that this be sent with patient.

## 2018-09-11 NOTE — Plan of Care (Signed)
Not progressing awaiting bed at [facility] for hospice care

## 2018-09-11 DEATH — deceased
